# Patient Record
Sex: Female | Born: 1985 | Race: Black or African American | Hispanic: No | Marital: Single | Smoking: Never smoker
Health system: Southern US, Community
[De-identification: ages and names within clinical notes are randomized; demographics above are authoritative.]

## PROBLEM LIST (undated history)

## (undated) ENCOUNTER — Inpatient Hospital Stay (HOSPITAL_COMMUNITY): Payer: Self-pay

## (undated) DIAGNOSIS — R7689 Other specified abnormal immunological findings in serum: Secondary | ICD-10-CM

## (undated) DIAGNOSIS — R768 Other specified abnormal immunological findings in serum: Secondary | ICD-10-CM

## (undated) DIAGNOSIS — I10 Essential (primary) hypertension: Secondary | ICD-10-CM

## (undated) HISTORY — PX: APPENDECTOMY: SHX54

---
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
## 2015-06-27 LAB — OB RESULTS CONSOLE RPR: RPR: NONREACTIVE

## 2015-06-27 LAB — OB RESULTS CONSOLE ABO/RH: RH Type: POSITIVE

## 2015-06-27 LAB — OB RESULTS CONSOLE GC/CHLAMYDIA
CHLAMYDIA, DNA PROBE: NEGATIVE
Gonorrhea: NEGATIVE

## 2015-06-27 LAB — OB RESULTS CONSOLE HIV ANTIBODY (ROUTINE TESTING): HIV: NONREACTIVE

## 2015-06-27 LAB — OB RESULTS CONSOLE RUBELLA ANTIBODY, IGM: Rubella: IMMUNE

## 2015-06-27 LAB — OB RESULTS CONSOLE ANTIBODY SCREEN: Antibody Screen: NEGATIVE

## 2015-06-27 LAB — OB RESULTS CONSOLE HEPATITIS B SURFACE ANTIGEN: Hepatitis B Surface Ag: NEGATIVE

## 2015-09-23 ENCOUNTER — Inpatient Hospital Stay (HOSPITAL_COMMUNITY)
Admission: AD | Admit: 2015-09-23 | Discharge: 2015-09-23 | Disposition: A | Discharge status: Home / Self Care | Payer: BLUE CROSS/BLUE SHIELD | Source: Ambulatory Visit | Attending: Obstetrics and Gynecology | Admitting: Obstetrics and Gynecology

## 2015-09-23 ENCOUNTER — Encounter (HOSPITAL_COMMUNITY): Payer: Self-pay | Admitting: *Deleted

## 2015-09-23 DIAGNOSIS — O10912 Unspecified pre-existing hypertension complicating pregnancy, second trimester: Secondary | ICD-10-CM

## 2015-09-23 DIAGNOSIS — Z87898 Personal history of other specified conditions: Secondary | ICD-10-CM

## 2015-09-23 DIAGNOSIS — Z3A23 23 weeks gestation of pregnancy: Secondary | ICD-10-CM | POA: Diagnosis not present

## 2015-09-23 DIAGNOSIS — O26892 Other specified pregnancy related conditions, second trimester: Secondary | ICD-10-CM | POA: Diagnosis not present

## 2015-09-23 DIAGNOSIS — R51 Headache: Secondary | ICD-10-CM | POA: Diagnosis present

## 2015-09-23 DIAGNOSIS — R519 Headache, unspecified: Secondary | ICD-10-CM

## 2015-09-23 HISTORY — DX: Essential (primary) hypertension: I10

## 2015-09-23 LAB — COMPREHENSIVE METABOLIC PANEL
ALT: 30 U/L (ref 14–54)
AST: 20 U/L (ref 15–41)
Albumin: 3 g/dL — ABNORMAL LOW (ref 3.5–5.0)
Alkaline Phosphatase: 59 U/L (ref 38–126)
Anion gap: 6 (ref 5–15)
BUN: 5 mg/dL — AB (ref 6–20)
CHLORIDE: 108 mmol/L (ref 101–111)
CO2: 22 mmol/L (ref 22–32)
CREATININE: 0.54 mg/dL (ref 0.44–1.00)
Calcium: 8.8 mg/dL — ABNORMAL LOW (ref 8.9–10.3)
GFR calc non Af Amer: >60 mL/min (ref >60)
Glucose, Bld: 86 mg/dL (ref 65–99)
POTASSIUM: 4 mmol/L (ref 3.5–5.1)
SODIUM: 136 mmol/L (ref 135–145)
Total Bilirubin: 0.3 mg/dL (ref 0.3–1.2)
Total Protein: 6.7 g/dL (ref 6.5–8.1)

## 2015-09-23 LAB — CBC
HCT: 29.9 % — ABNORMAL LOW (ref 36.0–46.0)
Hemoglobin: 10 g/dL — ABNORMAL LOW (ref 12.0–15.0)
MCH: 29.1 pg (ref 26.0–34.0)
MCHC: 33.4 g/dL (ref 30.0–36.0)
MCV: 86.9 fL (ref 78.0–100.0)
PLATELETS: 258 10*3/uL (ref 150–400)
RBC: 3.44 MIL/uL — AB (ref 3.87–5.11)
RDW: 15.1 % (ref 11.5–15.5)
WBC: 6.5 10*3/uL (ref 4.0–10.5)

## 2015-09-23 LAB — URINALYSIS, ROUTINE W REFLEX MICROSCOPIC
BILIRUBIN URINE: NEGATIVE
Glucose, UA: NEGATIVE mg/dL
Hgb urine dipstick: NEGATIVE
KETONES UR: NEGATIVE mg/dL
Leukocytes, UA: NEGATIVE
NITRITE: NEGATIVE
Protein, ur: NEGATIVE mg/dL
Specific Gravity, Urine: 1.015 (ref 1.005–1.030)
UROBILINOGEN UA: 0.2 mg/dL (ref 0.0–1.0)
pH: 6 (ref 5.0–8.0)

## 2015-09-23 LAB — PROTEIN / CREATININE RATIO, URINE
Creatinine, Urine: 102 mg/dL
Protein Creatinine Ratio: 0.1 mg/mg{Cre} (ref 0.00–0.15)
TOTAL PROTEIN, URINE: 10 mg/dL

## 2015-09-23 MED ORDER — IBUPROFEN 600 MG PO TABS
600.0000 mg | ORAL_TABLET | Freq: Once | ORAL | Status: AC
  Administered 2015-09-23: 600 mg via ORAL
  Filled 2015-09-23: qty 1

## 2015-09-23 MED ORDER — BUTALBITAL-APAP-CAFFEINE 50-325-40 MG PO TABS: 1.0000 | ORAL_TABLET | Freq: Four times a day (QID) | ORAL | Status: DC | PRN

## 2015-09-23 NOTE — Discharge Instructions (Signed)

## 2015-09-23 NOTE — MAU Note (Signed)
Headache since Sat and feet swollen Thurs but better now. Still has h/a. Took Tylenol 1000mg  Sat last 1600 but did not help. Chronic HTN but no meds during pregnancy. Was on ? Ziac before pregnancy. Took B/P at home this am and some were high and some lower. Some blurred vision on Friday. Denies LOF or bleeding.

## 2015-09-23 NOTE — MAU Provider Note (Signed)
Chief Complaint: Hypertension  First Provider Initiated Contact with Patient 09/23/15 0759     SUBJECTIVE HPI: Lauren Melton is a 29 y.o. G1P0 at [redacted]w[redacted]d by LMP who presents to maternity admissions reporting h/a with onset last night.  BP at home was 140s/90s this morning.  She has hx of hypertension before pregnancy and was taken off her daily BP medications.  She reports hx of brain tumor with chronic h/a afterwards but reports this is more severe than usual.  The h/a started on the left side but is now more on the right and is associated with nausea and light sensitivity.  She denies visual changes or epigastric pain. She denies abdominal pain, vaginal bleeding, vaginal itching/burning, urinary symptoms, dizziness, n/v, or fever/chills.     Headache  This is a new problem. The current episode started yesterday. The problem occurs constantly. The problem has been unchanged. The pain is located in the left unilateral region. The pain does not radiate. The pain quality is similar to prior headaches. The quality of the pain is described as squeezing and dull. The pain is moderate. Associated symptoms include nausea and photophobia. Pertinent negatives include no abdominal pain, blurred vision, dizziness, fever, neck pain, sinus pressure, visual change, vomiting or weakness. The symptoms are aggravated by bright light. She has tried acetaminophen for the symptoms. The treatment provided no relief. Her past medical history is significant for hypertension and migraine headaches.    Past Medical History  Diagnosis Date  . Hypertension    Past Surgical History  Procedure Laterality Date  . Brain tumor excision    . Appendectomy     Social History   Social History  . Marital Status: Single    Spouse Name: N/A  . Number of Children: N/A  . Years of Education: N/A   Occupational History  . Not on file.   Social History Main Topics  . Smoking status: Never Smoker   . Smokeless tobacco:  Never Used  . Alcohol Use: No  . Drug Use: No  . Sexual Activity: Yes   Other Topics Concern  . Not on file   Social History Narrative  . No narrative on file   No current facility-administered medications on file prior to encounter.   No current outpatient prescriptions on file prior to encounter.   No Known Allergies  ROS:  Review of Systems  Constitutional: Negative for fever, chills and fatigue.  HENT: Negative for sinus pressure.   Eyes: Positive for photophobia. Negative for blurred vision.  Respiratory: Negative for shortness of breath.   Cardiovascular: Negative for chest pain.  Gastrointestinal: Positive for nausea. Negative for vomiting, abdominal pain, diarrhea and constipation.  Genitourinary: Negative for dysuria, frequency, flank pain, vaginal bleeding, vaginal discharge, difficulty urinating, vaginal pain and pelvic pain.  Musculoskeletal: Negative for neck pain.  Neurological: Positive for headaches. Negative for dizziness and weakness.  Psychiatric/Behavioral: Negative.      I have reviewed patient's Past Medical Hx, Surgical Hx, Family Hx, Social Hx, medications and allergies.   Physical Exam   Patient Vitals for the past 24 hrs:  BP Temp Pulse Resp SpO2 Height Weight  09/23/15 0847 106/57 mmHg - 69 18 - - -  09/23/15 0648 126/79 mmHg 98.3 F (36.8 C) 90 22 100 % 5\' 7"  (1.702 m) 285 lb (129.275 kg)   Constitutional: Well-developed, well-nourished female in no acute distress.  HEART: normal rate, heart sounds, regular rhythm RESP: normal effort, lung sounds clear and equal bilaterally GI: Abd  soft, non-tender. Pos BS x 4 MS: Extremities nontender, no edema, normal ROM Neurologic: Alert and oriented x 4.  GU: Neg CVAT.  FHT 140 by doppler  LAB RESULTS Results for orders placed or performed during the hospital encounter of 09/23/15 (from the past 24 hour(s))  Urinalysis, Routine w reflex microscopic (not at [facility])     Status: None   Collection  Time: 09/23/15  6:40 AM  Result Value Ref Range   Color, Urine YELLOW YELLOW   APPearance CLEAR CLEAR   Specific Gravity, Urine 1.015 1.005 - 1.030   pH 6.0 5.0 - 8.0   Glucose, UA NEGATIVE NEGATIVE mg/dL   Hgb urine dipstick NEGATIVE NEGATIVE   Bilirubin Urine NEGATIVE NEGATIVE   Ketones, ur NEGATIVE NEGATIVE mg/dL   Protein, ur NEGATIVE NEGATIVE mg/dL   Urobilinogen, UA 0.2 0.0 - 1.0 mg/dL   Nitrite NEGATIVE NEGATIVE   Leukocytes, UA NEGATIVE NEGATIVE  Protein / creatinine ratio, urine     Status: None   Collection Time: 09/23/15  6:40 AM  Result Value Ref Range   Creatinine, Urine 102.00 mg/dL   Total Protein, Urine 10 mg/dL   Protein Creatinine Ratio 0.10 0.00 - 0.15 mg/mg[Cre]  CBC     Status: Abnormal   Collection Time: 09/23/15  8:35 AM  Result Value Ref Range   WBC 6.5 4.0 - 10.5 K/uL   RBC 3.44 (L) 3.87 - 5.11 MIL/uL   Hemoglobin 10.0 (L) 12.0 - 15.0 g/dL   HCT 29.9 (L) 36.0 - 46.0 %   MCV 86.9 78.0 - 100.0 fL   MCH 29.1 26.0 - 34.0 pg   MCHC 33.4 30.0 - 36.0 g/dL   RDW 15.1 11.5 - 15.5 %   Platelets 258 150 - 400 K/uL  Comprehensive metabolic panel     Status: Abnormal   Collection Time: 09/23/15  8:35 AM  Result Value Ref Range   Sodium 136 135 - 145 mmol/L   Potassium 4.0 3.5 - 5.1 mmol/L   Chloride 108 101 - 111 mmol/L   CO2 22 22 - 32 mmol/L   Glucose, Bld 86 65 - 99 mg/dL   BUN 5 (L) 6 - 20 mg/dL   Creatinine, Ser 0.54 0.44 - 1.00 mg/dL   Calcium 8.8 (L) 8.9 - 10.3 mg/dL   Total Protein 6.7 6.5 - 8.1 g/dL   Albumin 3.0 (L) 3.5 - 5.0 g/dL   AST 20 15 - 41 U/L   ALT 30 14 - 54 U/L   Alkaline Phosphatase 59 38 - 126 U/L   Total Bilirubin 0.3 0.3 - 1.2 mg/dL   GFR calc non Af Amer >60 >60 mL/min   GFR calc Af Amer >60 >60 mL/min   Anion gap 6 5 - 15    Report to [place], [place], CNM assumed care of pt at [phone]. CBC, CMET, P:C ratio and Ibuprofen ordered.   HA improved to 4/10. Discussed BP, Sx, Hx w/ Dr. Gaetano Net. Agrees w/ POC.  May Rx #10 Fioricet, but needs to inform provider if HA's are worsening or changing.   MDM Pt w/ Hx CHTN presented w/ HA, but has Hx chronic HA's too. Eval for for Pre-E neg. HA not C/W any apparent emergent intracranial process.    ASSESSMENT: 1. Headache in pregnancy, antepartum, second trimester   2. Chronic hypertension in pregnancy, second trimester   3. History of brain tumor    PLAN: D/C home in stable condition per consult w/ Dr. Gaetano Net. HA  red flags  And comfort measures reviewed.  Preeclampsia precautions.    Follow-up Information    Follow up with [facility] Marjean Donna, MD On 09/26/2015.   Specialty:  Obstetrics and Gynecology   Why:  Routine prenatal visit or sooner , As needed if symptoms worsen   Contact information:   [address] [place] [address] [phone]       Follow up with [place].   Why:  As needed in emergencies   Contact information:   [address] [id number] [place] [phone]        Medication List    TAKE these medications        acetaminophen 500 MG tablet  Commonly known as:  TYLENOL  Take 500 mg by mouth every 6 (six) hours as needed for mild pain or headache.     butalbital-acetaminophen-caffeine 50-325-40 MG per tablet  Commonly known as:  FIORICET  Take 1-2 tablets by mouth every 6 (six) hours as needed for headache.     prenatal multivitamin Tabs tablet  Take 1 tablet by mouth daily at 12 noon.       Fatima Blank Certified Nurse-Midwife 09/23/2015  8:08 AM  Manya Silvas, CNM 09/23/2015 9:31 AM

## 2015-12-28 ENCOUNTER — Inpatient Hospital Stay (HOSPITAL_COMMUNITY)
Admission: AD | Admit: 2015-12-28 | Discharge: 2015-12-28 | Disposition: A | Discharge status: Home / Self Care | Payer: BLUE CROSS/BLUE SHIELD | Source: Ambulatory Visit | Attending: Obstetrics & Gynecology | Admitting: Obstetrics & Gynecology

## 2015-12-28 ENCOUNTER — Encounter (HOSPITAL_COMMUNITY): Payer: Self-pay | Admitting: *Deleted

## 2015-12-28 DIAGNOSIS — R51 Headache: Secondary | ICD-10-CM | POA: Insufficient documentation

## 2015-12-28 DIAGNOSIS — O10913 Unspecified pre-existing hypertension complicating pregnancy, third trimester: Secondary | ICD-10-CM

## 2015-12-28 DIAGNOSIS — O10013 Pre-existing essential hypertension complicating pregnancy, third trimester: Secondary | ICD-10-CM | POA: Insufficient documentation

## 2015-12-28 DIAGNOSIS — Z3A37 37 weeks gestation of pregnancy: Secondary | ICD-10-CM | POA: Insufficient documentation

## 2015-12-28 HISTORY — DX: Other specified abnormal immunological findings in serum: R76.89

## 2015-12-28 HISTORY — DX: Other specified abnormal immunological findings in serum: R76.8

## 2015-12-28 LAB — COMPREHENSIVE METABOLIC PANEL
ALT: 11 U/L — AB (ref 14–54)
AST: 14 U/L — ABNORMAL LOW (ref 15–41)
Albumin: 3.1 g/dL — ABNORMAL LOW (ref 3.5–5.0)
Alkaline Phosphatase: 85 U/L (ref 38–126)
Anion gap: 9 (ref 5–15)
BUN: <5 mg/dL — ABNORMAL LOW (ref 6–20)
CHLORIDE: 103 mmol/L (ref 101–111)
CO2: 22 mmol/L (ref 22–32)
CREATININE: 0.6 mg/dL (ref 0.44–1.00)
Calcium: 9.1 mg/dL (ref 8.9–10.3)
GFR calc non Af Amer: >60 mL/min (ref >60)
Glucose, Bld: 100 mg/dL — ABNORMAL HIGH (ref 65–99)
Potassium: 4 mmol/L (ref 3.5–5.1)
SODIUM: 134 mmol/L — AB (ref 135–145)
Total Bilirubin: 0.2 mg/dL — ABNORMAL LOW (ref 0.3–1.2)
Total Protein: 7 g/dL (ref 6.5–8.1)

## 2015-12-28 LAB — URINALYSIS, ROUTINE W REFLEX MICROSCOPIC
BILIRUBIN URINE: NEGATIVE
Glucose, UA: NEGATIVE mg/dL
Hgb urine dipstick: NEGATIVE
Ketones, ur: NEGATIVE mg/dL
LEUKOCYTES UA: NEGATIVE
NITRITE: NEGATIVE
Protein, ur: NEGATIVE mg/dL
SPECIFIC GRAVITY, URINE: 1.01 (ref 1.005–1.030)
pH: 6 (ref 5.0–8.0)

## 2015-12-28 LAB — CBC
HCT: 31.6 % — ABNORMAL LOW (ref 36.0–46.0)
Hemoglobin: 10.7 g/dL — ABNORMAL LOW (ref 12.0–15.0)
MCH: 29.2 pg (ref 26.0–34.0)
MCHC: 33.9 g/dL (ref 30.0–36.0)
MCV: 86.3 fL (ref 78.0–100.0)
PLATELETS: 272 10*3/uL (ref 150–400)
RBC: 3.66 MIL/uL — AB (ref 3.87–5.11)
RDW: 16.8 % — ABNORMAL HIGH (ref 11.5–15.5)
WBC: 7.5 10*3/uL (ref 4.0–10.5)

## 2015-12-28 LAB — PROTEIN / CREATININE RATIO, URINE
CREATININE, URINE: 41 mg/dL
Protein Creatinine Ratio: 0.2 mg/mg{Cre} — ABNORMAL HIGH (ref 0.00–0.15)
TOTAL PROTEIN, URINE: 8 mg/dL

## 2015-12-28 LAB — LACTATE DEHYDROGENASE: LDH: 116 U/L (ref 98–192)

## 2015-12-28 LAB — URIC ACID: URIC ACID, SERUM: 4.6 mg/dL (ref 2.3–6.6)

## 2015-12-28 NOTE — Discharge Instructions (Signed)

## 2015-12-28 NOTE — MAU Provider Note (Signed)
History     CSN: [id number]  Arrival date and time: 12/28/15 1240   First Provider Initiated Contact with Patient 12/28/15 1323         Chief Complaint  Patient presents with  . Blood Pressure Check   HPI  Lauren Melton is a 29 y.o. G1P0 at [redacted]w[redacted]d who presents for hypertension.  Has history of chronic hypertension, hasn't been on meds since becoming pregnant. 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  BP was elevated in office yesterday. Took BP at home today & it was 135/86. Reports headache earlier today that has since resolved. Has history of headaches & states this headache was just like her normal headaches. Denies vision changes, epigastric pain, chest pain, or SOB.  Denies contractions, LOF, or vaginal bleeding.  Positive fetal movement.   OB History    Gravida Para Term Preterm AB TAB SAB Ectopic Multiple Living   1               Past Medical History  Diagnosis Date  . Hypertension     Past Surgical History  Procedure Laterality Date  . Brain tumor excision    . Appendectomy      History reviewed. No pertinent family history.  Social History  Substance Use Topics  . Smoking status: Never Smoker   . Smokeless tobacco: Never Used  . Alcohol Use: No    Allergies: No Known Allergies  Prescriptions prior to admission  Medication Sig Dispense Refill Last Dose  . acetaminophen (TYLENOL) 500 MG tablet Take 500 mg by mouth every 6 (six) hours as needed for mild pain or headache.   12/28/2015 at Unknown time  . ferrous sulfate 325 (65 FE) MG tablet Take 325 mg by mouth daily with breakfast.   12/28/2015 at Unknown time  . Prenatal Vit-Fe Fumarate-FA (PRENATAL MULTIVITAMIN) TABS tablet Take 1 tablet by mouth daily at 12 noon.   12/27/2015 at Unknown time  . valACYclovir (VALTREX) 500 MG tablet Take 500 mg by mouth 2 (two) times daily.   12/28/2015 at Unknown time  . butalbital-acetaminophen-caffeine (FIORICET)  50-325-40 MG per tablet Take 1-2 tablets by mouth every 6 (six) hours as needed for headache. (Patient not taking: Reported on 12/28/2015) 10 tablet 0     Review of Systems  Constitutional: Negative.   HENT: Negative.   Respiratory: Negative.   Cardiovascular: Negative.   Gastrointestinal: Negative.   Genitourinary: Negative.   Neurological: Negative for dizziness.   Physical Exam   Blood pressure 139/87, pulse 92, temperature 98.5 F (36.9 C), temperature source Oral, resp. rate 18, height 5\' 7"  (1.702 m), weight 298 lb 9.6 oz (135.444 kg), last menstrual period 04/13/2015.  Patient Vitals for the past 24 hrs:  BP Temp Temp src Pulse Resp Height Weight  12/28/15 1539 125/82 mmHg - - 90 - - -  12/28/15 1502 132/74 mmHg - - 82 - - -  12/28/15 1447 145/82 mmHg - - 98 - - -  12/28/15 1437 132/88 mmHg - - 95 - - -  12/28/15 1402 138/78 mmHg - - 88 - - -  12/28/15 1347 106/63 mmHg - - 92 - - -  12/28/15 1315 133/74 mmHg - - 95 - - -  12/28/15 1305 139/87 mmHg - - 92 - - -  12/28/15 1255 141/88 mmHg - - 103 - - -  12/28/15 1252 141/88 mmHg 98.5 F (36.9 C) Oral 103 18 - -  12/28/15 1246 - - - - -  5\' 7"  (1.702 m) 298 lb 9.6 oz (135.444 kg)     Physical Exam  Nursing note and vitals reviewed. Constitutional: She is oriented to person, place, and time. She appears well-developed and well-nourished. No distress.  HENT:  Head: Normocephalic and atraumatic.  Eyes: Conjunctivae are normal. Right eye exhibits no discharge. Left eye exhibits no discharge. No scleral icterus.  Neck: Normal range of motion.  Cardiovascular: Normal rate, regular rhythm and normal heart sounds.   No murmur heard. Respiratory: Effort normal and breath sounds normal. No respiratory distress. She has no wheezes.  GI: Soft. There is no tenderness.  Neurological: She is alert and oriented to person, place, and time. She has normal reflexes.  No clonus  Skin: Skin is warm and dry. She is not diaphoretic.   Psychiatric: She has a normal mood and affect. Her behavior is normal. Judgment and thought content normal.   Fetal Tracing:  Baseline: 155 Variability: moderate Accelerations: 15x15, with episodes of prolonged accelaration Decelerations: none  Toco: irregular   MAU Course  Procedures Results for orders placed or performed during the hospital encounter of 12/28/15 (from the past 24 hour(s))  Urinalysis, Routine w reflex microscopic (not at [facility])     Status: Abnormal   Collection Time: 12/28/15 12:50 PM  Result Value Ref Range   Color, Urine YELLOW YELLOW   APPearance HAZY (A) CLEAR   Specific Gravity, Urine 1.010 1.005 - 1.030   pH 6.0 5.0 - 8.0   Glucose, UA NEGATIVE NEGATIVE mg/dL   Hgb urine dipstick NEGATIVE NEGATIVE   Bilirubin Urine NEGATIVE NEGATIVE   Ketones, ur NEGATIVE NEGATIVE mg/dL   Protein, ur NEGATIVE NEGATIVE mg/dL   Nitrite NEGATIVE NEGATIVE   Leukocytes, UA NEGATIVE NEGATIVE  Protein / creatinine ratio, urine     Status: Abnormal   Collection Time: 12/28/15 12:50 PM  Result Value Ref Range   Creatinine, Urine 41.00 mg/dL   Total Protein, Urine 8 mg/dL   Protein Creatinine Ratio 0.20 (H) 0.00 - 0.15 mg/mg[Cre]  CBC     Status: Abnormal   Collection Time: 12/28/15  1:25 PM  Result Value Ref Range   WBC 7.5 4.0 - 10.5 K/uL   RBC 3.66 (L) 3.87 - 5.11 MIL/uL   Hemoglobin 10.7 (L) 12.0 - 15.0 g/dL   HCT 31.6 (L) 36.0 - 46.0 %   MCV 86.3 78.0 - 100.0 fL   MCH 29.2 26.0 - 34.0 pg   MCHC 33.9 30.0 - 36.0 g/dL   RDW 16.8 (H) 11.5 - 15.5 %   Platelets 272 150 - 400 K/uL  Comprehensive metabolic panel     Status: Abnormal   Collection Time: 12/28/15  1:25 PM  Result Value Ref Range   Sodium 134 (L) 135 - 145 mmol/L   Potassium 4.0 3.5 - 5.1 mmol/L   Chloride 103 101 - 111 mmol/L   CO2 22 22 - 32 mmol/L   Glucose, Bld 100 (H) 65 - 99 mg/dL   BUN <5 (L) 6 - 20 mg/dL   Creatinine, Ser 0.60 0.44 - 1.00 mg/dL   Calcium 9.1 8.9 - 10.3 mg/dL   Total Protein  7.0 6.5 - 8.1 g/dL   Albumin 3.1 (L) 3.5 - 5.0 g/dL   AST 14 (L) 15 - 41 U/L   ALT 11 (L) 14 - 54 U/L   Alkaline Phosphatase 85 38 - 126 U/L   Total Bilirubin 0.2 (L) 0.3 - 1.2 mg/dL   GFR calc non Af Amer >60 >60  mL/min   GFR calc Af Amer >60 >60 mL/min   Anion gap 9 5 - 15  Lactate dehydrogenase     Status: None   Collection Time: 12/28/15  1:25 PM  Result Value Ref Range   LDH 116 98 - 192 U/L  Uric acid     Status: None   Collection Time: 12/28/15  1:25 PM  Result Value Ref Range   Uric Acid, Serum 4.6 2.3 - 6.6 mg/dL    MDM PIH labs pending No severe range BPs Category 1 tracing with episodes of prolonged acceleration S/w Dr. Lynnette Caffey. Discussed FHT, BPs, & labs. Strip reviewed by Dr. Lynnette Caffey. Ok to discharge home.   Assessment and Plan  A: 1. Chronic hypertension in pregnancy, third trimester    P: Discharge home Discussed reasons to return Keep scheduled f/u with ob HTN in pregnancy & fetal kick count education given  Jorje Guild, NP   12/28/2015, 1:22 PM

## 2015-12-28 NOTE — MAU Note (Signed)
Pt went to the doctor yesterday and had high blood pressures.  It was 138/90 in the office.  Pt states she took her pressure around 0930 today and it was 135/86.  Pt states she is feeling the baby move.

## 2016-01-07 ENCOUNTER — Encounter (HOSPITAL_COMMUNITY): Payer: Self-pay | Admitting: *Deleted

## 2016-01-07 ENCOUNTER — Inpatient Hospital Stay (HOSPITAL_COMMUNITY)
Admission: AD | Admit: 2016-01-07 | Discharge: 2016-01-07 | Disposition: A | Discharge status: Home / Self Care | Payer: BLUE CROSS/BLUE SHIELD | Source: Ambulatory Visit | Attending: Obstetrics and Gynecology | Admitting: Obstetrics and Gynecology

## 2016-01-07 DIAGNOSIS — Z3493 Encounter for supervision of normal pregnancy, unspecified, third trimester: Secondary | ICD-10-CM | POA: Insufficient documentation

## 2016-01-07 LAB — URINALYSIS, ROUTINE W REFLEX MICROSCOPIC
Bilirubin Urine: NEGATIVE
Glucose, UA: NEGATIVE mg/dL
Hgb urine dipstick: NEGATIVE
Ketones, ur: NEGATIVE mg/dL
Leukocytes, UA: NEGATIVE
Nitrite: NEGATIVE
Protein, ur: NEGATIVE mg/dL
Specific Gravity, Urine: 1.01 (ref 1.005–1.030)
pH: 6 (ref 5.0–8.0)

## 2016-01-07 LAB — POCT FERN TEST: POCT Fern Test: NEGATIVE

## 2016-01-07 NOTE — Discharge Instructions (Signed)
Braxton Hicks Contractions °Contractions of the uterus can occur throughout pregnancy. Contractions are not always a sign that you are in labor.  °WHAT ARE BRAXTON HICKS CONTRACTIONS?  °Contractions that occur before labor are called Braxton Hicks contractions, or false labor. Toward the end of pregnancy (32-34 weeks), these contractions can develop more often and may become more forceful. This is not true labor because these contractions do not result in opening (dilatation) and thinning of the cervix. They are sometimes difficult to tell apart from true labor because these contractions can be forceful and people have different pain tolerances. You should not feel embarrassed if you go to the hospital with false labor. Sometimes, the only way to tell if you are in true labor is for your health care provider to look for changes in the cervix. °If there are no prenatal problems or other health problems associated with the pregnancy, it is completely safe to be sent home with false labor and await the onset of true labor. °HOW CAN YOU TELL THE DIFFERENCE BETWEEN TRUE AND FALSE LABOR? °False Labor °· The contractions of false labor are usually shorter and not as hard as those of true labor.   °· The contractions are usually irregular.   °· The contractions are often felt in the front of the lower abdomen and in the groin.   °· The contractions may go away when you walk around or change positions while lying down.   °· The contractions get weaker and are shorter lasting as time goes on.   °· The contractions do not usually become progressively stronger, regular, and closer together as with true labor.   °True Labor °1. Contractions in true labor last 30-70 seconds, become very regular, usually become more intense, and increase in frequency.   °2. The contractions do not go away with walking.   °3. The discomfort is usually felt in the top of the uterus and spreads to the lower abdomen and low back.   °4. True labor can  be determined by your health care provider with an exam. This will show that the cervix is dilating and getting thinner.   °WHAT TO REMEMBER °· Keep up with your usual exercises and follow other instructions given by your health care provider.   °· Take medicines as directed by your health care provider.   °· Keep your regular prenatal appointments.   °· Eat and drink lightly if you think you are going into labor.   °· If Braxton Hicks contractions are making you uncomfortable:   °· Change your position from lying down or resting to walking, or from walking to resting.   °· Sit and rest in a tub of warm water.   °· Drink 2-3 glasses of water. Dehydration may cause these contractions.   °· Do slow and deep breathing several times an hour.   °WHEN SHOULD I SEEK IMMEDIATE MEDICAL CARE? °Seek immediate medical care if: °· Your contractions become stronger, more regular, and closer together.   °· You have fluid leaking or gushing from your vagina.   °· You have a fever.   °· You pass blood-tinged mucus.   °· You have vaginal bleeding.   °· You have continuous abdominal pain.   °· You have low back pain that you never had before.   °· You feel your baby's head pushing down and causing pelvic pressure.   °· Your baby is not moving as much as it used to.   °  °This information is not intended to replace advice given to you by your health care provider. Make sure you discuss any questions you have with your health care   provider. °  °Document Released: 12/15/2005 Document Revised: 12/20/2013 Document Reviewed: 09/26/2013 °Elsevier Interactive Patient Education ©2016 [employer] ° °Fetal Movement Counts °Patient Name: __________________________________________________ Patient Due Date: ____________________ °Performing a fetal movement count is highly recommended in high-risk pregnancies, but it is good for every pregnant woman to do. Your health care provider may ask you to start counting fetal movements at 28 weeks of the  pregnancy. Fetal movements often increase: °· After eating a full meal. °· After physical activity. °· After eating or drinking something sweet or cold. °· At rest. °Pay attention to when you feel the baby is most active. This will help you notice a pattern of your baby's sleep and wake cycles and what factors contribute to an increase in fetal movement. It is important to perform a fetal movement count at the same time each day when your baby is normally most active.  °HOW TO COUNT FETAL MOVEMENTS °5. Find a quiet and comfortable area to sit or lie down on your left side. Lying on your left side provides the best blood and oxygen circulation to your baby. °6. Write down the day and time on a sheet of paper or in a journal. °7. Start counting kicks, flutters, swishes, rolls, or jabs in a 2-hour period. You should feel at least 10 movements within 2 hours. °8. If you do not feel 10 movements in 2 hours, wait 2-3 hours and count again. Look for a change in the pattern or not enough counts in 2 hours. °SEEK MEDICAL CARE IF: °· You feel less than 10 counts in 2 hours, tried twice. °· There is no movement in over an hour. °· The pattern is changing or taking longer each day to reach 10 counts in 2 hours. °· You feel the baby is not moving as he or she usually does. °Date: ____________ Movements: ____________ Start time: ____________ Finish time: ____________  °Date: ____________ Movements: ____________ Start time: ____________ Finish time: ____________ °Date: ____________ Movements: ____________ Start time: ____________ Finish time: ____________ °Date: ____________ Movements: ____________ Start time: ____________ Finish time: ____________ °Date: ____________ Movements: ____________ Start time: ____________ Finish time: ____________ °Date: ____________ Movements: ____________ Start time: ____________ Finish time: ____________ °Date: ____________ Movements: ____________ Start time: ____________ Finish time:  ____________ °Date: ____________ Movements: ____________ Start time: ____________ Finish time: ____________  °Date: ____________ Movements: ____________ Start time: ____________ Finish time: ____________ °Date: ____________ Movements: ____________ Start time: ____________ Finish time: ____________ °Date: ____________ Movements: ____________ Start time: ____________ Finish time: ____________ °Date: ____________ Movements: ____________ Start time: ____________ Finish time: ____________ °Date: ____________ Movements: ____________ Start time: ____________ Finish time: ____________ °Date: ____________ Movements: ____________ Start time: ____________ Finish time: ____________ °Date: ____________ Movements: ____________ Start time: ____________ Finish time: ____________  °Date: ____________ Movements: ____________ Start time: ____________ Finish time: ____________ °Date: ____________ Movements: ____________ Start time: ____________ Finish time: ____________ °Date: ____________ Movements: ____________ Start time: ____________ Finish time: ____________ °Date: ____________ Movements: ____________ Start time: ____________ Finish time: ____________ °Date: ____________ Movements: ____________ Start time: ____________ Finish time: ____________ °Date: ____________ Movements: ____________ Start time: ____________ Finish time: ____________ °Date: ____________ Movements: ____________ Start time: ____________ Finish time: ____________  °Date: ____________ Movements: ____________ Start time: ____________ Finish time: ____________ °Date: ____________ Movements: ____________ Start time: ____________ Finish time: ____________ °Date: ____________ Movements: ____________ Start time: ____________ Finish time: ____________ °Date: ____________ Movements: ____________ Start time: ____________ Finish time: ____________ °Date: ____________ Movements: ____________ Start time: ____________ Finish time: ____________ °Date: ____________ Movements:  ____________ Start time: ____________ Finish   time: ____________ °Date: ____________ Movements: ____________ Start time: ____________ Finish time: ____________  °Date: ____________ Movements: ____________ Start time: ____________ Finish time: ____________ °Date: ____________ Movements: ____________ Start time: ____________ Finish time: ____________ °Date: ____________ Movements: ____________ Start time: ____________ Finish time: ____________ °Date: ____________ Movements: ____________ Start time: ____________ Finish time: ____________ °Date: ____________ Movements: ____________ Start time: ____________ Finish time: ____________ °Date: ____________ Movements: ____________ Start time: ____________ Finish time: ____________ °Date: ____________ Movements: ____________ Start time: ____________ Finish time: ____________  °Date: ____________ Movements: ____________ Start time: ____________ Finish time: ____________ °Date: ____________ Movements: ____________ Start time: ____________ Finish time: ____________ °Date: ____________ Movements: ____________ Start time: ____________ Finish time: ____________ °Date: ____________ Movements: ____________ Start time: ____________ Finish time: ____________ °Date: ____________ Movements: ____________ Start time: ____________ Finish time: ____________ °Date: ____________ Movements: ____________ Start time: ____________ Finish time: ____________ °Date: ____________ Movements: ____________ Start time: ____________ Finish time: ____________  °Date: ____________ Movements: ____________ Start time: ____________ Finish time: ____________ °Date: ____________ Movements: ____________ Start time: ____________ Finish time: ____________ °Date: ____________ Movements: ____________ Start time: ____________ Finish time: ____________ °Date: ____________ Movements: ____________ Start time: ____________ Finish time: ____________ °Date: ____________ Movements: ____________ Start time: ____________ Finish  time: ____________ °Date: ____________ Movements: ____________ Start time: ____________ Finish time: ____________ °Date: ____________ Movements: ____________ Start time: ____________ Finish time: ____________  °Date: ____________ Movements: ____________ Start time: ____________ Finish time: ____________ °Date: ____________ Movements: ____________ Start time: ____________ Finish time: ____________ °Date: ____________ Movements: ____________ Start time: ____________ Finish time: ____________ °Date: ____________ Movements: ____________ Start time: ____________ Finish time: ____________ °Date: ____________ Movements: ____________ Start time: ____________ Finish time: ____________ °Date: ____________ Movements: ____________ Start time: ____________ Finish time: ____________ °  °This information is not intended to replace advice given to you by your health care provider. Make sure you discuss any questions you have with your health care provider. °  °Document Released: 01/14/2007 Document Revised: 01/05/2015 Document Reviewed: 10/11/2012 °Elsevier Interactive Patient Education ©2016 [employer] ° °

## 2016-01-07 NOTE — MAU Note (Signed)
Pt said her underwear were wet this morning with a white discharge and again after being at work for a few hours.  Pain at the bottom of her stomach, tightening that comes and goes.  Denies VB.

## 2016-01-12 ENCOUNTER — Encounter (HOSPITAL_COMMUNITY): Payer: Self-pay | Admitting: *Deleted

## 2016-01-12 ENCOUNTER — Inpatient Hospital Stay (HOSPITAL_COMMUNITY)
Admission: AD | Admit: 2016-01-12 | Discharge: 2016-01-12 | Disposition: A | Discharge status: Home / Self Care | Payer: BLUE CROSS/BLUE SHIELD | Source: Ambulatory Visit | Attending: Obstetrics and Gynecology | Admitting: Obstetrics and Gynecology

## 2016-01-12 DIAGNOSIS — M549 Dorsalgia, unspecified: Secondary | ICD-10-CM | POA: Diagnosis present

## 2016-01-12 DIAGNOSIS — O471 False labor at or after 37 completed weeks of gestation: Secondary | ICD-10-CM

## 2016-01-12 DIAGNOSIS — O479 False labor, unspecified: Secondary | ICD-10-CM

## 2016-01-12 DIAGNOSIS — Z3A39 39 weeks gestation of pregnancy: Secondary | ICD-10-CM | POA: Insufficient documentation

## 2016-01-12 DIAGNOSIS — O10913 Unspecified pre-existing hypertension complicating pregnancy, third trimester: Secondary | ICD-10-CM | POA: Insufficient documentation

## 2016-01-12 LAB — URINALYSIS, ROUTINE W REFLEX MICROSCOPIC
BILIRUBIN URINE: NEGATIVE
Glucose, UA: NEGATIVE mg/dL
KETONES UR: NEGATIVE mg/dL
Leukocytes, UA: NEGATIVE
NITRITE: NEGATIVE
PH: 5.5 (ref 5.0–8.0)
Protein, ur: NEGATIVE mg/dL
SPECIFIC GRAVITY, URINE: 1.01 (ref 1.005–1.030)

## 2016-01-12 LAB — COMPREHENSIVE METABOLIC PANEL
ALBUMIN: 3.3 g/dL — AB (ref 3.5–5.0)
ALT: 14 U/L (ref 14–54)
AST: 16 U/L (ref 15–41)
Alkaline Phosphatase: 96 U/L (ref 38–126)
Anion gap: 10 (ref 5–15)
BILIRUBIN TOTAL: 0.5 mg/dL (ref 0.3–1.2)
BUN: 6 mg/dL (ref 6–20)
CHLORIDE: 107 mmol/L (ref 101–111)
CO2: 21 mmol/L — ABNORMAL LOW (ref 22–32)
CREATININE: 0.64 mg/dL (ref 0.44–1.00)
Calcium: 9.5 mg/dL (ref 8.9–10.3)
GFR calc Af Amer: >60 mL/min (ref >60)
GFR calc non Af Amer: >60 mL/min (ref >60)
GLUCOSE: 75 mg/dL (ref 65–99)
POTASSIUM: 3.6 mmol/L (ref 3.5–5.1)
Sodium: 138 mmol/L (ref 135–145)
Total Protein: 7.4 g/dL (ref 6.5–8.1)

## 2016-01-12 LAB — CBC
HEMATOCRIT: 32.8 % — AB (ref 36.0–46.0)
Hemoglobin: 11.2 g/dL — ABNORMAL LOW (ref 12.0–15.0)
MCH: 29.2 pg (ref 26.0–34.0)
MCHC: 34.1 g/dL (ref 30.0–36.0)
MCV: 85.4 fL (ref 78.0–100.0)
PLATELETS: 284 10*3/uL (ref 150–400)
RBC: 3.84 MIL/uL — AB (ref 3.87–5.11)
RDW: 16.8 % — AB (ref 11.5–15.5)
WBC: 9.4 10*3/uL (ref 4.0–10.5)

## 2016-01-12 LAB — URINE MICROSCOPIC-ADD ON

## 2016-01-12 LAB — PROTEIN / CREATININE RATIO, URINE
Creatinine, Urine: 100 mg/dL
Protein Creatinine Ratio: 0.18 mg/mg{Cre} — ABNORMAL HIGH (ref 0.00–0.15)
Total Protein, Urine: 18 mg/dL

## 2016-01-12 LAB — ABO/RH: ABO/RH(D): B POS

## 2016-01-12 LAB — TYPE AND SCREEN
ABO/RH(D): B POS
ANTIBODY SCREEN: NEGATIVE

## 2016-01-12 MED ORDER — OXYCODONE-ACETAMINOPHEN 5-325 MG PO TABS: 1.0000 | ORAL_TABLET | Freq: Four times a day (QID) | ORAL | Status: DC | PRN

## 2016-01-12 MED ORDER — NIFEDIPINE 10 MG PO CAPS
10.0000 mg | ORAL_CAPSULE | Freq: Three times a day (TID) | ORAL | Status: DC
Start: 2016-01-12 — End: 2016-01-14

## 2016-01-12 MED ORDER — NIFEDIPINE 10 MG PO CAPS
20.0000 mg | ORAL_CAPSULE | Freq: Once | ORAL | Status: AC
  Administered 2016-01-12: 20 mg via ORAL
  Filled 2016-01-12: qty 2

## 2016-01-12 MED ORDER — LACTATED RINGERS IV BOLUS (SEPSIS)
1000.0000 mL | Freq: Once | INTRAVENOUS | Status: AC
  Administered 2016-01-12: 1000 mL via INTRAVENOUS

## 2016-01-12 MED ORDER — BUTORPHANOL TARTRATE 1 MG/ML IJ SOLN
1.0000 mg | Freq: Once | INTRAMUSCULAR | Status: AC
  Administered 2016-01-12: 1 mg via INTRAVENOUS
  Filled 2016-01-12: qty 1

## 2016-01-12 NOTE — MAU Note (Addendum)
Pain in lower back and pelvic, coming every 8-10 min.  Has a mucous d/c  Has not been checked, is scheduled for a c/s on Wed 1/18, for herpes

## 2016-01-12 NOTE — MAU Provider Note (Signed)
MAU HISTORY AND PHYSICAL  Chief Complaint:  Back Pain and Pelvic Pain   Lauren Melton is a 30 y.o.  G1P0 with IUP at [redacted]w[redacted]d presenting for Back Pain and Pelvic Pain  Pain comes and goes, lower abdomen. Here Monday with similar pains, fingertip. Plan for c/s 2/2 hsv. No leakage of fluid. No bleeding. Positive fetal movement. No headache, vision change, ruq pain, or sob.    Past Medical History  Diagnosis Date  . Hypertension   . 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 Brain tumor excision    . Appendectomy      History reviewed. No pertinent family history.  Social History  Substance Use Topics  . Smoking status: Never Smoker   . Smokeless tobacco: Never Used  . Alcohol Use: No    No Known Allergies  Prescriptions prior to admission  Medication Sig Dispense Refill Last Dose  . acetaminophen (TYLENOL) 500 MG tablet Take 500-1,000 mg by mouth every 6 (six) hours as needed for mild pain or headache.    Past Week at Unknown time  . Prenatal Vit-Fe Fumarate-FA (PRENATAL MULTIVITAMIN) TABS tablet Take 1 tablet by mouth daily at 12 noon.   01/12/2016 at Unknown time  . valACYclovir (VALTREX) 500 MG tablet Take 500 mg by mouth 2 (two) times daily.   01/12/2016 at Unknown time  . butalbital-acetaminophen-caffeine (FIORICET) 50-325-40 MG per tablet Take 1-2 tablets by mouth every 6 (six) hours as needed for headache. (Patient not taking: Reported on 12/28/2015) 10 tablet 0 Not Taking at Unknown time    Review of Systems - Negative except for what is mentioned in HPI.  Physical Exam  Blood pressure 134/68, pulse 95, temperature 98.3 F (36.8 C), temperature source Oral, resp. rate 20, last menstrual period 04/13/2015. GENERAL: Well-developed, well-nourished female in no acute distress.  LUNGS: Clear to auscultation bilaterally.  HEART: Regular rate and rhythm. ABDOMEN: Soft, nontender, nondistended, gravid.   EXTREMITIES: Nontender, no edema, 2+ distal pulses. Cervical Exam: 1.5/50/-3 Presentation: cephalic FHT:  0000000 Contractions: Every 3-5 mins   Labs: Results for orders placed or performed during the hospital encounter of 01/12/16 (from the past 24 hour(s))  Protein / creatinine ratio, urine   Collection Time: 01/12/16  4:07 PM  Result Value Ref Range   Creatinine, Urine 100.00 mg/dL   Total Protein, Urine 18 mg/dL   Protein Creatinine Ratio 0.18 (H) 0.00 - 0.15 mg/mg[Cre]  Urinalysis, Routine w reflex microscopic (not at [facility])   Collection Time: 01/12/16  4:07 PM  Result Value Ref Range   Color, Urine YELLOW YELLOW   APPearance CLEAR CLEAR   Specific Gravity, Urine 1.010 1.005 - 1.030   pH 5.5 5.0 - 8.0   Glucose, UA NEGATIVE NEGATIVE mg/dL   Hgb urine dipstick TRACE (A) NEGATIVE   Bilirubin Urine NEGATIVE NEGATIVE   Ketones, ur NEGATIVE NEGATIVE mg/dL   Protein, ur NEGATIVE NEGATIVE mg/dL   Nitrite NEGATIVE NEGATIVE   Leukocytes, UA NEGATIVE NEGATIVE  Urine microscopic-add on   Collection Time: 01/12/16  4:07 PM  Result Value Ref Range   Squamous Epithelial / LPF 0-5 (A) NONE SEEN   WBC, UA 0-5 0 - 5 WBC/hpf   RBC / HPF 0-5 0 - 5 RBC/hpf   Bacteria, UA FEW (A) NONE SEEN  CBC   Collection Time: 01/12/16  4:15 PM  Result Value Ref Range   WBC 9.4 4.0 - 10.5 K/uL  RBC 3.84 (L) 3.87 - 5.11 MIL/uL   Hemoglobin 11.2 (L) 12.0 - 15.0 g/dL   HCT 32.8 (L) 36.0 - 46.0 %   MCV 85.4 78.0 - 100.0 fL   MCH 29.2 26.0 - 34.0 pg   MCHC 34.1 30.0 - 36.0 g/dL   RDW 16.8 (H) 11.5 - 15.5 %   Platelets 284 150 - 400 K/uL  Comprehensive metabolic panel   Collection Time: 01/12/16  4:15 PM  Result Value Ref Range   Sodium 138 135 - 145 mmol/L   Potassium 3.6 3.5 - 5.1 mmol/L   Chloride 107 101 - 111 mmol/L   CO2 21 (L) 22 - 32 mmol/L   Glucose, Bld 75 65 - 99 mg/dL   BUN 6 6 - 20 mg/dL   Creatinine, Ser 0.64 0.44 - 1.00 mg/dL   Calcium 9.5 8.9 - 10.3 mg/dL   Total Protein  7.4 6.5 - 8.1 g/dL   Albumin 3.3 (L) 3.5 - 5.0 g/dL   AST 16 15 - 41 U/L   ALT 14 14 - 54 U/L   Alkaline Phosphatase 96 38 - 126 U/L   Total Bilirubin 0.5 0.3 - 1.2 mg/dL   GFR calc non Af Amer >60 >60 mL/min   GFR calc Af Amer >60 >60 mL/min   Anion gap 10 5 - 15  Type and screen   Collection Time: 01/12/16  4:15 PM  Result Value Ref Range   ABO/RH(D) B POS    Antibody Screen NEG    Sample Expiration 01/15/2016     Imaging Studies:  No results found.  Assessment: Lauren Melton is  30 y.o. G1P0 at [redacted]w[redacted]d presents with [facility] contractions. No cervical change after 2 hours of monitoring. Painful contractions improved after stadol and nifedipine and IV fluids. NST reactive but baseline elevated, improved to normal baseline after above intervention. Membranes intact. UA not suggestive of infection. Initial bp severe range, after that wnl to moderate elevation, which is acceptable in pt w/ chronic htn. No preE symptoms; preE labs unremarkable. Discussed w/ Dr. Corinna Capra.  Plan: - hydration, percocet/nifedipine prn, labor/prom return precautions - call clinic on Monday to be seen, consider c/s that day instead of 2/1  Desma Maxim 1/14/20176:18 PM

## 2016-01-12 NOTE — Discharge Instructions (Signed)
Braxton Hicks Contractions °Contractions of the uterus can occur throughout pregnancy. Contractions are not always a sign that you are in labor.  °WHAT ARE BRAXTON HICKS CONTRACTIONS?  °Contractions that occur before labor are called Braxton Hicks contractions, or false labor. Toward the end of pregnancy (32-34 weeks), these contractions can develop more often and may become more forceful. This is not true labor because these contractions do not result in opening (dilatation) and thinning of the cervix. They are sometimes difficult to tell apart from true labor because these contractions can be forceful and people have different pain tolerances. You should not feel embarrassed if you go to the hospital with false labor. Sometimes, the only way to tell if you are in true labor is for your health care provider to look for changes in the cervix. °If there are no prenatal problems or other health problems associated with the pregnancy, it is completely safe to be sent home with false labor and await the onset of true labor. °HOW CAN YOU TELL THE DIFFERENCE BETWEEN TRUE AND FALSE LABOR? °False Labor °· The contractions of false labor are usually shorter and not as hard as those of true labor.   °· The contractions are usually irregular.   °· The contractions are often felt in the front of the lower abdomen and in the groin.   °· The contractions may go away when you walk around or change positions while lying down.   °· The contractions get weaker and are shorter lasting as time goes on.   °· The contractions do not usually become progressively stronger, regular, and closer together as with true labor.   °True Labor °· Contractions in true labor last 30-70 seconds, become very regular, usually become more intense, and increase in frequency.   °· The contractions do not go away with walking.   °· The discomfort is usually felt in the top of the uterus and spreads to the lower abdomen and low back.   °· True labor can be  determined by your health care provider with an exam. This will show that the cervix is dilating and getting thinner.   °WHAT TO REMEMBER °· Keep up with your usual exercises and follow other instructions given by your health care provider.   °· Take medicines as directed by your health care provider.   °· Keep your regular prenatal appointments.   °· Eat and drink lightly if you think you are going into labor.   °· If Braxton Hicks contractions are making you uncomfortable:   °¨ Change your position from lying down or resting to walking, or from walking to resting.   °¨ Sit and rest in a tub of warm water.   °¨ Drink 2-3 glasses of water. Dehydration may cause these contractions.   °¨ Do slow and deep breathing several times an hour.   °WHEN SHOULD I SEEK IMMEDIATE MEDICAL CARE? °Seek immediate medical care if: °· Your contractions become stronger, more regular, and closer together.   °· You have fluid leaking or gushing from your vagina.   °· You have a fever.   °· You pass blood-tinged mucus.   °· You have vaginal bleeding.   °· You have continuous abdominal pain.   °· You have low back pain that you never had before.   °· You feel your baby's head pushing down and causing pelvic pressure.   °· Your baby is not moving as much as it used to.   °  °This information is not intended to replace advice given to you by your health care provider. Make sure you discuss any questions you have with your health care   provider. °  °Document Released: 12/15/2005 Document Revised: 12/20/2013 Document Reviewed: 09/26/2013 °Elsevier Interactive Patient Education ©2016 [employer] Inc. ° °

## 2016-01-13 ENCOUNTER — Inpatient Hospital Stay (HOSPITAL_COMMUNITY)
Admission: AD | Admit: 2016-01-13 | Discharge: 2016-01-16 | DRG: 765 | Disposition: A | Discharge status: Home / Self Care | Payer: BLUE CROSS/BLUE SHIELD | Source: Ambulatory Visit | Attending: Obstetrics and Gynecology | Admitting: Obstetrics and Gynecology

## 2016-01-13 ENCOUNTER — Encounter (HOSPITAL_COMMUNITY)
Admission: AD | Disposition: A | Discharge status: Home / Self Care | Payer: Self-pay | Source: Ambulatory Visit | Attending: Obstetrics and Gynecology

## 2016-01-13 ENCOUNTER — Encounter (HOSPITAL_COMMUNITY): Payer: Self-pay | Admitting: *Deleted

## 2016-01-13 DIAGNOSIS — O164 Unspecified maternal hypertension, complicating childbirth: Secondary | ICD-10-CM | POA: Diagnosis present

## 2016-01-13 DIAGNOSIS — Z6841 Body Mass Index (BMI) 40.0 and over, adult: Secondary | ICD-10-CM

## 2016-01-13 DIAGNOSIS — O99824 Streptococcus B carrier state complicating childbirth: Secondary | ICD-10-CM | POA: Diagnosis present

## 2016-01-13 DIAGNOSIS — Z3A39 39 weeks gestation of pregnancy: Secondary | ICD-10-CM

## 2016-01-13 DIAGNOSIS — O99214 Obesity complicating childbirth: Secondary | ICD-10-CM | POA: Diagnosis present

## 2016-01-13 LAB — RPR: RPR Ser Ql: NONREACTIVE

## 2016-01-13 SURGERY — Surgical Case
Anesthesia: Spinal

## 2016-01-13 MED ORDER — FAMOTIDINE IN NACL 20-0.9 MG/50ML-% IV SOLN
20.0000 mg | Freq: Once | INTRAVENOUS | Status: AC
  Administered 2016-01-14: 20 mg via INTRAVENOUS
  Filled 2016-01-13: qty 50

## 2016-01-13 MED ORDER — LACTATED RINGERS IV SOLN
INTRAVENOUS | Status: DC
  Administered 2016-01-14 (×4): via INTRAVENOUS

## 2016-01-13 MED ORDER — CITRIC ACID-SODIUM CITRATE 334-500 MG/5ML PO SOLN
30.0000 mL | Freq: Once | ORAL | Status: AC
  Administered 2016-01-14: 30 mL via ORAL
  Filled 2016-01-13: qty 15

## 2016-01-13 SURGICAL SUPPLY — 29 items
CLAMP CORD UMBIL (MISCELLANEOUS) IMPLANT
CLOSURE STERI STRIP 1/2 X4 (GAUZE/BANDAGES/DRESSINGS) ×2 IMPLANT
CLOTH BEACON ORANGE TIMEOUT ST (SAFETY) ×2 IMPLANT
DRAPE SHEET LG 3/4 BI-LAMINATE (DRAPES) IMPLANT
DRSG OPSITE POSTOP 4X10 (GAUZE/BANDAGES/DRESSINGS) ×2 IMPLANT
DURAPREP 26ML APPLICATOR (WOUND CARE) ×2 IMPLANT
ELECT REM PT RETURN 9FT ADLT (ELECTROSURGICAL) ×2
ELECTRODE REM PT RTRN 9FT ADLT (ELECTROSURGICAL) ×1 IMPLANT
EXTRACTOR VACUUM M CUP 4 TUBE (SUCTIONS) IMPLANT
GLOVE BIOGEL PI IND STRL 7.0 (GLOVE) ×1 IMPLANT
GLOVE BIOGEL PI INDICATOR 7.0 (GLOVE) ×1
GLOVE SURG ORTHO 8.0 STRL STRW (GLOVE) ×2 IMPLANT
GOWN STRL REUS W/TWL LRG LVL3 (GOWN DISPOSABLE) ×4 IMPLANT
KIT ABG SYR 3ML LUER SLIP (SYRINGE) ×2 IMPLANT
NEEDLE HYPO 25X5/8 SAFETYGLIDE (NEEDLE) ×2 IMPLANT
NS IRRIG 1000ML POUR BTL (IV SOLUTION) ×2 IMPLANT
PACK C SECTION WH (CUSTOM PROCEDURE TRAY) ×2 IMPLANT
PAD OB MATERNITY 4.3X12.25 (PERSONAL CARE ITEMS) ×2 IMPLANT
PENCIL SMOKE EVAC W/HOLSTER (ELECTROSURGICAL) ×2 IMPLANT
RTRCTR C-SECT PINK 25CM LRG (MISCELLANEOUS) ×2 IMPLANT
SUT MNCRL 0 VIOLET CTX 36 (SUTURE) ×3 IMPLANT
SUT MON AB 4-0 PS1 27 (SUTURE) ×2 IMPLANT
SUT MONOCRYL 0 CTX 36 (SUTURE) ×3
SUT PDS AB 1 CT  36 (SUTURE)
SUT PDS AB 1 CT 36 (SUTURE) IMPLANT
SUT VIC AB 1 CTX 36 (SUTURE)
SUT VIC AB 1 CTX36XBRD ANBCTRL (SUTURE) IMPLANT
TOWEL OR 17X24 6PK STRL BLUE (TOWEL DISPOSABLE) ×2 IMPLANT
TRAY FOLEY CATH SILVER 14FR (SET/KITS/TRAYS/PACK) ×2 IMPLANT

## 2016-01-13 NOTE — MAU Note (Signed)
Contractions since last night. No leaking or Bleeding.

## 2016-01-13 NOTE — H&P (Signed)
Lauren Melton is a 30 y.o. female presenting for active labor with ctxs q 3 and cx 4 cm.. History of HSV ab but never had active lesions.  Pt denies sxs but is on valtrex suppresion and request primary c/s.  Preg also complicated by GBS+ and CHTN . History OB History    Gravida Para Term Preterm AB TAB SAB Ectopic Multiple Living   1              Past Medical History  Diagnosis Date  . Hypertension   . 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 Brain tumor excision    . Appendectomy     Family History: family history is not on file. Social History:  reports that she has never smoked. She has never used smokeless tobacco. She reports that she does not drink alcohol or use illicit drugs.   Prenatal Transfer Tool  Maternal Diabetes: No Genetic Screening: Abnormal:  Results: Other: sickle trait  Maternal Ultrasounds/Referrals: Normal Fetal Ultrasounds or other Referrals:  None Maternal Substance Abuse:  No Significant Maternal Medications:  None Significant Maternal Lab Results:  None Other Comments:  None  ROS  Dilation: 4.5 (Simultaneous filing. User may not have seen previous data.) Effacement (%): 90 (Simultaneous filing. User may not have seen previous data.) Station: -2 (Simultaneous filing. User may not have seen previous data.) Exam by:: Etter Sjogren RN, Joya Gaskins RN (Simultaneous filing. User may not have seen previous data.) Blood pressure 149/86, pulse 87, temperature 98.2 F (36.8 C), temperature source Oral, resp. rate 18, height 5\' 6"  (1.676 m), weight 302 lb (136.986 kg), last menstrual period 04/13/2015. Exam Physical Exam  Prenatal labs: ABO, Rh: --/--/B POS, B POS (01/14 1615) Antibody: NEG (01/14 1615) Rubella: Immune (06/29 0000) RPR: Non Reactive (01/14 1615)  HBsAg: Negative (06/29 0000)  HIV: Non-reactive (06/29 0000)  GBS:     Assessment/Plan: IUp at term Active labor Hx of  HSV seropositive without active lesions (or history of lesions) Pt refuses TOL and requests primary C/S CHTN - no evidence of PIH Risks and benefits of C/S were discussed.  All questions were answered and informed consent was obtained.  Plan to proceed with low segment transverse Cesarean Section.  Olof Marcil C 01/13/2016, 11:54 PM

## 2016-01-14 ENCOUNTER — Other Ambulatory Visit (HOSPITAL_COMMUNITY): Payer: BLUE CROSS/BLUE SHIELD

## 2016-01-14 ENCOUNTER — Encounter (HOSPITAL_COMMUNITY): Payer: Self-pay | Admitting: *Deleted

## 2016-01-14 ENCOUNTER — Inpatient Hospital Stay (HOSPITAL_COMMUNITY): Payer: BLUE CROSS/BLUE SHIELD | Admitting: Anesthesiology

## 2016-01-14 ENCOUNTER — Encounter (HOSPITAL_COMMUNITY): Payer: Self-pay | Admitting: Certified Registered Nurse Anesthetist

## 2016-01-14 DIAGNOSIS — Z6841 Body Mass Index (BMI) 40.0 and over, adult: Secondary | ICD-10-CM | POA: Diagnosis not present

## 2016-01-14 DIAGNOSIS — O99824 Streptococcus B carrier state complicating childbirth: Secondary | ICD-10-CM | POA: Diagnosis present

## 2016-01-14 DIAGNOSIS — O99214 Obesity complicating childbirth: Secondary | ICD-10-CM | POA: Diagnosis present

## 2016-01-14 DIAGNOSIS — Z3A39 39 weeks gestation of pregnancy: Secondary | ICD-10-CM | POA: Diagnosis not present

## 2016-01-14 DIAGNOSIS — O164 Unspecified maternal hypertension, complicating childbirth: Secondary | ICD-10-CM | POA: Diagnosis present

## 2016-01-14 LAB — CBC
HCT: 31.2 % — ABNORMAL LOW (ref 36.0–46.0)
HEMATOCRIT: 27.6 % — AB (ref 36.0–46.0)
HEMOGLOBIN: 9.3 g/dL — AB (ref 12.0–15.0)
Hemoglobin: 10.8 g/dL — ABNORMAL LOW (ref 12.0–15.0)
MCH: 28.7 pg (ref 26.0–34.0)
MCH: 29.3 pg (ref 26.0–34.0)
MCHC: 33.7 g/dL (ref 30.0–36.0)
MCHC: 34.6 g/dL (ref 30.0–36.0)
MCV: 84.8 fL (ref 78.0–100.0)
MCV: 85.2 fL (ref 78.0–100.0)
PLATELETS: 255 10*3/uL (ref 150–400)
Platelets: 236 10*3/uL (ref 150–400)
RBC: 3.24 MIL/uL — ABNORMAL LOW (ref 3.87–5.11)
RBC: 3.68 MIL/uL — ABNORMAL LOW (ref 3.87–5.11)
RDW: 16.8 % — ABNORMAL HIGH (ref 11.5–15.5)
RDW: 16.9 % — AB (ref 11.5–15.5)
WBC: 10.4 10*3/uL (ref 4.0–10.5)
WBC: 8.9 10*3/uL (ref 4.0–10.5)

## 2016-01-14 LAB — COMPREHENSIVE METABOLIC PANEL
ALBUMIN: 3.2 g/dL — AB (ref 3.5–5.0)
ALT: 13 U/L — ABNORMAL LOW (ref 14–54)
ANION GAP: 9 (ref 5–15)
AST: 16 U/L (ref 15–41)
Alkaline Phosphatase: 103 U/L (ref 38–126)
BUN: 7 mg/dL (ref 6–20)
CHLORIDE: 107 mmol/L (ref 101–111)
CO2: 22 mmol/L (ref 22–32)
Calcium: 9.5 mg/dL (ref 8.9–10.3)
Creatinine, Ser: 0.48 mg/dL (ref 0.44–1.00)
GFR calc Af Amer: >60 mL/min (ref >60)
GFR calc non Af Amer: >60 mL/min (ref >60)
GLUCOSE: 94 mg/dL (ref 65–99)
POTASSIUM: 3.9 mmol/L (ref 3.5–5.1)
SODIUM: 138 mmol/L (ref 135–145)
TOTAL PROTEIN: 6.8 g/dL (ref 6.5–8.1)
Total Bilirubin: 0.4 mg/dL (ref 0.3–1.2)

## 2016-01-14 LAB — TYPE AND SCREEN
ABO/RH(D): B POS
ANTIBODY SCREEN: NEGATIVE

## 2016-01-14 LAB — RPR: RPR: NONREACTIVE

## 2016-01-14 MED ORDER — FENTANYL CITRATE (PF) 100 MCG/2ML IJ SOLN
INTRAMUSCULAR | Status: DC | PRN
  Administered 2016-01-14: 12.5 ug via INTRATHECAL

## 2016-01-14 MED ORDER — KETOROLAC TROMETHAMINE 30 MG/ML IJ SOLN
INTRAMUSCULAR | Status: AC
  Administered 2016-01-14: 30 mg via INTRAVENOUS
  Filled 2016-01-14: qty 1

## 2016-01-14 MED ORDER — SIMETHICONE 80 MG PO CHEW
80.0000 mg | CHEWABLE_TABLET | ORAL | Status: DC
  Administered 2016-01-14 – 2016-01-16 (×2): 80 mg via ORAL
  Filled 2016-01-14 (×2): qty 1

## 2016-01-14 MED ORDER — KETOROLAC TROMETHAMINE 30 MG/ML IJ SOLN
30.0000 mg | Freq: Four times a day (QID) | INTRAMUSCULAR | Status: AC | PRN
  Administered 2016-01-14: 30 mg via INTRAVENOUS

## 2016-01-14 MED ORDER — SIMETHICONE 80 MG PO CHEW
80.0000 mg | CHEWABLE_TABLET | ORAL | Status: DC | PRN
  Filled 2016-01-14: qty 1

## 2016-01-14 MED ORDER — MORPHINE SULFATE (PF) 0.5 MG/ML IJ SOLN
INTRAMUSCULAR | Status: DC | PRN
  Administered 2016-01-14: .1 mg via INTRATHECAL

## 2016-01-14 MED ORDER — OXYCODONE HCL 5 MG PO TABS: 10.0000 mg | ORAL_TABLET | ORAL | Status: DC | PRN

## 2016-01-14 MED ORDER — DIPHENHYDRAMINE HCL 50 MG/ML IJ SOLN: 12.5000 mg | INTRAMUSCULAR | Status: DC | PRN

## 2016-01-14 MED ORDER — OXYCODONE HCL 5 MG PO TABS: 5.0000 mg | ORAL_TABLET | ORAL | Status: DC | PRN

## 2016-01-14 MED ORDER — IBUPROFEN 600 MG PO TABS
600.0000 mg | ORAL_TABLET | Freq: Four times a day (QID) | ORAL | Status: DC
  Administered 2016-01-14 – 2016-01-16 (×9): 600 mg via ORAL
  Filled 2016-01-14 (×10): qty 1

## 2016-01-14 MED ORDER — SIMETHICONE 80 MG PO CHEW
80.0000 mg | CHEWABLE_TABLET | Freq: Three times a day (TID) | ORAL | Status: DC
  Administered 2016-01-14 – 2016-01-15 (×5): 80 mg via ORAL
  Filled 2016-01-14 (×6): qty 1

## 2016-01-14 MED ORDER — NALBUPHINE HCL 10 MG/ML IJ SOLN
5.0000 mg | INTRAMUSCULAR | Status: DC | PRN
  Administered 2016-01-14 (×2): 5 mg via INTRAVENOUS
  Filled 2016-01-14 (×2): qty 1

## 2016-01-14 MED ORDER — NALBUPHINE HCL 10 MG/ML IJ SOLN: 5.0000 mg | Freq: Once | INTRAMUSCULAR | Status: DC | PRN

## 2016-01-14 MED ORDER — DEXTROSE 5 % IV SOLN
3.0000 g | INTRAVENOUS | Status: DC
  Filled 2016-01-14: qty 3000

## 2016-01-14 MED ORDER — MORPHINE SULFATE (PF) 0.5 MG/ML IJ SOLN
INTRAMUSCULAR | Status: AC
  Filled 2016-01-14: qty 10

## 2016-01-14 MED ORDER — DIPHENHYDRAMINE HCL 25 MG PO CAPS: 25.0000 mg | ORAL_CAPSULE | ORAL | Status: DC | PRN

## 2016-01-14 MED ORDER — OXYTOCIN 10 UNIT/ML IJ SOLN: 2.5000 [IU]/h | INTRAMUSCULAR | Status: AC

## 2016-01-14 MED ORDER — SCOPOLAMINE 1 MG/3DAYS TD PT72
MEDICATED_PATCH | TRANSDERMAL | Status: AC
  Filled 2016-01-14: qty 1

## 2016-01-14 MED ORDER — WITCH HAZEL-GLYCERIN EX PADS: 1.0000 "application " | MEDICATED_PAD | CUTANEOUS | Status: DC | PRN

## 2016-01-14 MED ORDER — ONDANSETRON HCL 4 MG/2ML IJ SOLN
INTRAMUSCULAR | Status: AC
  Filled 2016-01-14: qty 2

## 2016-01-14 MED ORDER — MENTHOL 3 MG MT LOZG: 1.0000 | LOZENGE | OROMUCOSAL | Status: DC | PRN

## 2016-01-14 MED ORDER — BUPIVACAINE IN DEXTROSE 0.75-8.25 % IT SOLN
INTRATHECAL | Status: DC | PRN
  Administered 2016-01-14: 10.5 mg via INTRATHECAL

## 2016-01-14 MED ORDER — PHENYLEPHRINE 8 MG IN D5W 100 ML (0.08MG/ML) PREMIX OPTIME
INJECTION | INTRAVENOUS | Status: AC
  Filled 2016-01-14: qty 100

## 2016-01-14 MED ORDER — DIPHENHYDRAMINE HCL 25 MG PO CAPS: 25.0000 mg | ORAL_CAPSULE | Freq: Four times a day (QID) | ORAL | Status: DC | PRN

## 2016-01-14 MED ORDER — SENNOSIDES-DOCUSATE SODIUM 8.6-50 MG PO TABS
2.0000 | ORAL_TABLET | ORAL | Status: DC
  Administered 2016-01-14: 2 via ORAL
  Filled 2016-01-14 (×2): qty 2

## 2016-01-14 MED ORDER — LACTATED RINGERS IV SOLN: INTRAVENOUS | Status: DC

## 2016-01-14 MED ORDER — OXYTOCIN 10 UNIT/ML IJ SOLN
40.0000 [IU] | INTRAVENOUS | Status: DC | PRN
  Administered 2016-01-14: 40 [IU] via INTRAVENOUS

## 2016-01-14 MED ORDER — PHENYLEPHRINE 8 MG IN D5W 100 ML (0.08MG/ML) PREMIX OPTIME
INJECTION | INTRAVENOUS | Status: DC | PRN
  Administered 2016-01-14: 60 ug/min via INTRAVENOUS

## 2016-01-14 MED ORDER — KETOROLAC TROMETHAMINE 30 MG/ML IJ SOLN: 30.0000 mg | Freq: Four times a day (QID) | INTRAMUSCULAR | Status: AC | PRN

## 2016-01-14 MED ORDER — FENTANYL CITRATE (PF) 100 MCG/2ML IJ SOLN
INTRAMUSCULAR | Status: AC
Start: 2016-01-14 — End: 2016-01-14
  Filled 2016-01-14: qty 2

## 2016-01-14 MED ORDER — SCOPOLAMINE 1 MG/3DAYS TD PT72
MEDICATED_PATCH | TRANSDERMAL | Status: DC | PRN
  Administered 2016-01-14: 1 via TRANSDERMAL

## 2016-01-14 MED ORDER — SCOPOLAMINE 1 MG/3DAYS TD PT72
1.0000 | MEDICATED_PATCH | Freq: Once | TRANSDERMAL | Status: DC
Start: 2016-01-14 — End: 2016-01-16
  Filled 2016-01-14: qty 1

## 2016-01-14 MED ORDER — DEXTROSE 5 % IV SOLN
2.0000 g | INTRAVENOUS | Status: DC | PRN
  Administered 2016-01-14: 2 g via INTRAVENOUS

## 2016-01-14 MED ORDER — PRENATAL MULTIVITAMIN CH
1.0000 | ORAL_TABLET | Freq: Every day | ORAL | Status: DC
  Administered 2016-01-14 – 2016-01-15 (×2): 1 via ORAL
  Filled 2016-01-14 (×3): qty 1

## 2016-01-14 MED ORDER — LANOLIN HYDROUS EX OINT
1.0000 | TOPICAL_OINTMENT | CUTANEOUS | Status: DC | PRN
Start: 2016-01-14 — End: 2016-01-16

## 2016-01-14 MED ORDER — ZOLPIDEM TARTRATE 5 MG PO TABS: 5.0000 mg | ORAL_TABLET | Freq: Every evening | ORAL | Status: DC | PRN

## 2016-01-14 MED ORDER — DIBUCAINE 1 % RE OINT: 1.0000 "application " | TOPICAL_OINTMENT | RECTAL | Status: DC | PRN

## 2016-01-14 MED ORDER — SODIUM CHLORIDE 0.9 % IJ SOLN: 3.0000 mL | INTRAMUSCULAR | Status: DC | PRN

## 2016-01-14 MED ORDER — NALOXONE HCL 2 MG/2ML IJ SOSY
1.0000 ug/kg/h | PREFILLED_SYRINGE | INTRAVENOUS | Status: DC | PRN
  Filled 2016-01-14: qty 2

## 2016-01-14 MED ORDER — NALBUPHINE HCL 10 MG/ML IJ SOLN: 5.0000 mg | INTRAMUSCULAR | Status: DC | PRN

## 2016-01-14 MED ORDER — ONDANSETRON HCL 4 MG/2ML IJ SOLN
INTRAMUSCULAR | Status: DC | PRN
  Administered 2016-01-14: 4 mg via INTRAVENOUS

## 2016-01-14 MED ORDER — TETANUS-DIPHTH-ACELL PERTUSSIS 5-2.5-18.5 LF-MCG/0.5 IM SUSP: 0.5000 mL | Freq: Once | INTRAMUSCULAR | Status: DC

## 2016-01-14 MED ORDER — ONDANSETRON HCL 4 MG/2ML IJ SOLN: 4.0000 mg | Freq: Three times a day (TID) | INTRAMUSCULAR | Status: DC | PRN

## 2016-01-14 MED ORDER — LACTATED RINGERS IV SOLN
INTRAVENOUS | Status: DC | PRN
  Administered 2016-01-14: 01:00:00 via INTRAVENOUS

## 2016-01-14 MED ORDER — FENTANYL CITRATE (PF) 100 MCG/2ML IJ SOLN: 25.0000 ug | INTRAMUSCULAR | Status: DC | PRN

## 2016-01-14 MED ORDER — MEPERIDINE HCL 25 MG/ML IJ SOLN
6.2500 mg | INTRAMUSCULAR | Status: DC | PRN
Start: 2016-01-14 — End: 2016-01-14

## 2016-01-14 MED ORDER — OXYTOCIN 10 UNIT/ML IJ SOLN
INTRAMUSCULAR | Status: AC
  Filled 2016-01-14: qty 4

## 2016-01-14 MED ORDER — ACETAMINOPHEN 325 MG PO TABS: 650.0000 mg | ORAL_TABLET | ORAL | Status: DC | PRN

## 2016-01-14 MED ORDER — NALOXONE HCL 0.4 MG/ML IJ SOLN: 0.4000 mg | INTRAMUSCULAR | Status: DC | PRN

## 2016-01-14 NOTE — Anesthesia Postprocedure Evaluation (Signed)
Anesthesia Post Note  Patient: Lauren Melton  Procedure(s) Performed: Procedure(s) (LRB): CESAREAN SECTION (N/A)  Patient location during evaluation: Mother Baby Anesthesia Type: Epidural Level of consciousness: awake and alert Pain management: pain level controlled Vital Signs Assessment: post-procedure vital signs reviewed and stable Respiratory status: spontaneous breathing Cardiovascular status: stable Postop Assessment: no headache, no backache, no signs of nausea or vomiting and adequate PO intake Anesthetic complications: no    Last Vitals:  Filed Vitals:   01/14/16 0508 01/14/16 0608  BP: 134/67 129/68  Pulse: 82 89  Temp: 36.7 C 36.9 C  Resp: 21     Last Pain:  Filed Vitals:   01/14/16 0637  PainSc: 0-No pain                 Amaurie Wandel Hristova

## 2016-01-14 NOTE — Lactation Note (Signed)
This note was copied from the chart of [place]. Lactation Consultation Note; Initial visit with mom, baby now 54 hours old. Mom states she is about to give up- reports that baby is getting his hands in the way and he is taking some time to get latched. Encouragement given that he is learning and to keep trying. Reports no pain with latch that she feels only tugging. Reports she fed for 10 min about 30 min ago. Baby is asleep in bassinet at this time. Baby had a 30 min feeding earlier today. No questions at present. BF brochure given with resources for support after DC. To call for assist prn  Patient Name: [name] [id number] Date: 01/14/2016 Reason for consult: Initial assessment   Maternal Data Formula Feeding for Exclusion: Yes Reason for exclusion: Mother's choice to formula and breast feed on admission Does the patient have breastfeeding experience prior to this delivery?: No  Feeding Feeding Type: Breast Fed Length of feed: 5 min  LATCH Score/Interventions                      Lactation Tools Discussed/Used     Consult Status Consult Status: Follow-up Date: 01/14/16 Follow-up type: In-patient    Truddie Crumble 01/14/2016, 11:46 AM

## 2016-01-14 NOTE — Progress Notes (Signed)
Subjective: Postpartum Day DOS: Cesarean Delivery Patient reports tolerating PO.    Objective: Vital signs in last 24 hours: Temp:  [98.1 F (36.7 C)-99 F (37.2 C)] 98.4 F (36.9 C) (01/16 [id number]) Pulse Rate:  [76-89] 89 (01/16 0608) Resp:  [18-23] 21 (01/16 0508) BP: (129-151)/(67-102) 129/68 mmHg (01/16 0608) SpO2:  [97 %-100 %] 97 % (01/16 [id number]) Weight:  [302 lb (136.986 kg)] 302 lb (136.986 kg) (01/15 2308)  Physical Exam:  General: alert, cooperative and no distress Lochia: appropriate Uterine Fundus: firm Incision: healing well DVT Evaluation: No evidence of DVT seen on physical exam.   Recent Labs  01/13/16 2355 01/14/16 0525  HGB 10.8* 9.3*  HCT 31.2* 27.6*    Assessment/Plan: Status post Cesarean section. Doing well postoperatively.  Continue current care.  Lauren Melton,Lauren Melton E 01/14/2016, 9:08 AM

## 2016-01-14 NOTE — Op Note (Signed)
Cesarean Section Procedure Note  Pre-operative Diagnosis: IUP at 39+ weeks, Labor, hx HSV, Pt requests C/S  Post-operative Diagnosis: same  Surgeon: Luz Lex   Assistants: none  Anesthesia: Spinal  Procedure:  Low Segment Transverse cesarean section  Procedure Details  The patient was seen in the Holding Room. The risks, benefits, complications, treatment options, and expected outcomes were discussed with the patient.  The patient concurred with the proposed plan, giving informed consent.  The site of surgery properly noted/marked.. A Time Out was held and the above information confirmed.  After induction of anesthesia, the patient was draped and prepped in the usual sterile manner. A Pfannenstiel incision was made and carried down through the subcutaneous tissue to the fascia. Fascial incision was made and extended transversely. The fascia was separated from the underlying rectus tissue superiorly and inferiorly. The peritoneum was identified and entered. Peritoneal incision was extended longitudinally. The utero-vesical peritoneal reflection was incised transversely and the bladder flap was bluntly freed from the lower uterine segment. A low transverse uterine incision was made. Delivered from vertex presentation was a baby with Apgar scores of 8 at one minute and 9 at five minutes. After the umbilical cord was clamped and cut cord blood was obtained for evaluation. The placenta was removed intact and appeared normal. The uterine outline, tubes and ovaries appeared normal. The uterine incision was closed with running locked sutures of 0 monocryl and imbricated with 0 monocryl. Hemostasis was observed. Lavage was carried out until clear. The peritoneum was then closed with 0 monocryl and rectus muscles plicated in the midline.  After hemostasis was assured, the fascia was then reapproximated with running sutures of 0 PDS. Irrigation was applied and after adequate hemostasis was assured, the  skin was reapproximated with subcutaneous sutures using 4-0 monocryl.  Instrument, sponge, and needle counts were correct prior the abdominal closure and at the conclusion of the case. The patient received 2 grams cefotetan preoperatively.  Findings: Viable female  Estimated Blood Loss:  700cc         Specimens: Placenta was sent to labor and delivery         Complications:  None

## 2016-01-14 NOTE — Anesthesia Postprocedure Evaluation (Signed)
Anesthesia Post Note  Patient: Lauren Melton  Procedure(s) Performed: Procedure(s) (LRB): CESAREAN SECTION (N/A)  Patient location during evaluation: PACU Anesthesia Type: Spinal Level of consciousness: oriented and awake and alert Pain management: pain level controlled Vital Signs Assessment: post-procedure vital signs reviewed and stable Respiratory status: spontaneous breathing and respiratory function stable Cardiovascular status: blood pressure returned to baseline and stable Postop Assessment: no headache and no backache Anesthetic complications: no    Last Vitals:  Filed Vitals:   01/14/16 0300 01/14/16 0308  BP: 146/84 135/94  Pulse: 81 81  Temp:  36.7 C  Resp: 22 20    Last Pain:  Filed Vitals:   01/14/16 0311  PainSc: 0-No pain                 Montez Hageman

## 2016-01-14 NOTE — Anesthesia Procedure Notes (Signed)
Spinal Patient location during procedure: OR Staffing Anesthesiologist: Montez Hageman Performed by: anesthesiologist  Preanesthetic Checklist Completed: patient identified, site marked, surgical consent, pre-op evaluation, timeout performed, IV checked, risks and benefits discussed and monitors and equipment checked Spinal Block Patient position: sitting Prep: Betadine Patient monitoring: heart rate, continuous pulse ox and blood pressure Approach: midline Location: L4-5 Injection technique: single-shot Needle Needle type: Sprotte  Needle gauge: 24 G Needle length: 9 cm Additional Notes Expiration date of kit checked and confirmed. Patient tolerated procedure well, without complications.

## 2016-01-14 NOTE — Transfer of Care (Signed)
Immediate Anesthesia Transfer of Care Note  Patient: Lauren Melton  Procedure(s) Performed: Procedure(s): CESAREAN SECTION (N/A)  Patient Location: PACU  Anesthesia Type:Spinal  Level of Consciousness: awake  Airway & Oxygen Therapy: Patient Spontanous Breathing  Post-op Assessment: Report given to RN and Post -op Vital signs reviewed and stable  Post vital signs: stable  Last Vitals:  Filed Vitals:   01/13/16 2308  BP: 149/86  Pulse: 87  Temp: 36.8 C  Resp: 18    Complications: No apparent anesthesia complications

## 2016-01-14 NOTE — Addendum Note (Signed)
Addendum  created 01/14/16 0851 by Hewitt Blade, CRNA   Modules edited: Clinical Notes   Clinical Notes:  File: [id number]

## 2016-01-14 NOTE — Anesthesia Preprocedure Evaluation (Signed)
Anesthesia Evaluation  Patient identified by MRN, date of birth, ID band Patient awake    Reviewed: Allergy & Precautions, NPO status , Patient's Chart, lab work & pertinent test results  Airway Mallampati: II  TM Distance: >3 FB Neck ROM: Full    Dental no notable dental hx.    Pulmonary neg pulmonary ROS,    Pulmonary exam normal breath sounds clear to auscultation       Cardiovascular hypertension, negative cardio ROS Normal cardiovascular exam Rhythm:Regular Rate:Normal     Neuro/Psych negative neurological ROS  negative psych ROS   GI/Hepatic negative GI ROS, Neg liver ROS,   Endo/Other  Morbid obesity  Renal/GU negative Renal ROS  negative genitourinary   Musculoskeletal negative musculoskeletal ROS (+)   Abdominal   Peds negative pediatric ROS (+)  Hematology negative hematology ROS (+)   Anesthesia Other Findings   Reproductive/Obstetrics (+) Pregnancy                             Anesthesia Physical Anesthesia Plan  ASA: III  Anesthesia Plan: Spinal   Post-op Pain Management:    Induction:   Airway Management Planned: Natural Airway  Additional Equipment:   Intra-op Plan:   Post-operative Plan:   Informed Consent: I have reviewed the patients History and Physical, chart, labs and discussed the procedure including the risks, benefits and alternatives for the proposed anesthesia with the patient or authorized representative who has indicated his/her understanding and acceptance.   Dental advisory given  Plan Discussed with: CRNA  Anesthesia Plan Comments:         Anesthesia Quick Evaluation

## 2016-01-15 ENCOUNTER — Encounter (HOSPITAL_COMMUNITY): Payer: Self-pay | Admitting: Obstetrics and Gynecology

## 2016-01-15 LAB — BIRTH TISSUE RECOVERY COLLECTION (PLACENTA DONATION)

## 2016-01-15 NOTE — Lactation Note (Signed)
This note was copied from the chart of [place]. Lactation Consultation Note  Patient Name: Boy Dnia Streight [id number] Date: 01/15/2016 Reason for consult: Follow-up assessment   Follow up with mom of 46 hour old infant. Infant with 2 BF attempts for 5 minutes and 5 bottle feeds of 7-35 cc, 2 voids and 2 stools in last 24 hours. Infant weight 8 lb 10.8 oz with no change in weight in first 12 hours of life. Mom reports infant was circumcised this morning and has not fed since 3 am. She reports she has given bottles and would like to get infant back to breast. Mom with large fleshy pendulous breasts, nipples are semi flat and become erect with manual stimulation, areolas are easily compressible. Several gtts of Colostrum was easily expressed from right side. Mom was attempting to latch infant to right breast in football hold, She had infant in good position. Infant awake and did not want to open mouth and noted to be tongue sucking. After assisting mom for a few minutes infant latched to right breast. He maintained suckling and a few intermittent swallows were heard. Mom denies pain and did well with keeping infant stimulated during feeding. Enc mom to BF at least 8-12 x in 24 hours at first feeding cues and if formula given to give after BF. Discussed supply and demand stomach size, NB Nutritional leeds, NL NB Feeding behavior, cluster feeding and milk coming to volume. and need for frequent stimulation to breast to encourage milk to come in. Advised mom that if formula to be given, give post BF and reviewed appropriate amounts based on infants age. Mom has a Ped appt scheduled for Friday for infant. Mom is planning to apply for [facility] after d/c. Enc mom to call out for assistance with latch as needed. Mom voiced understanding to all teaching.    Maternal Data Formula Feeding for Exclusion: No Does the patient have breastfeeding experience prior to this delivery?: No  Feeding Feeding  Type: Breast Fed  LATCH Score/Interventions Latch: Grasps breast easily, tongue down, lips flanged, rhythmical sucking. Intervention(s): Adjust position;Assist with latch;Breast massage;Breast compression  Audible Swallowing: A few with stimulation Intervention(s): Skin to skin  Type of Nipple: Flat (everts with stimulation) Intervention(s): No intervention needed  Comfort (Breast/Nipple): Soft / non-tender     Hold (Positioning): Assistance needed to correctly position infant at breast and maintain latch. Intervention(s): Breastfeeding basics reviewed;Support Pillows;Position options;Skin to skin  LATCH Score: 7  Lactation Tools Discussed/Used WIC Program:  (Plans to apply)   Consult Status Consult Status: Follow-up Date: 01/16/16 Follow-up type: In-patient    Debby Freiberg Sarh Kirschenbaum 01/15/2016, 12:48 PM

## 2016-01-15 NOTE — Progress Notes (Signed)
Patient doing well. No complaints.  BP 144/69 mmHg  Pulse 100  Temp(Src) 98 F (36.7 C) (Oral)  Resp 18  Ht 5\' 6"  (1.676 m)  Wt 302 lb (136.986 kg)  BMI 48.77 kg/m2  SpO2 100%  LMP 04/13/2015  Breastfeeding? Unknown Results for orders placed or performed during the hospital encounter of 01/13/16 (from the past 24 hour(s))  Collect bld for placenta donatation     Status: None   Collection Time: 01/15/16  6:07 AM  Result Value Ref Range   Placenta donation bld collect COLLECTED BY LABORATORY    Afebrile  Vital signs stable Abdomen is soft and non tender Incision is clean dry and intact  IMPRESSION: POD #1 Doing well Routine care circ today for baby

## 2016-01-16 ENCOUNTER — Inpatient Hospital Stay (HOSPITAL_COMMUNITY)
Admission: RE | Admit: 2016-01-16 | Payer: BLUE CROSS/BLUE SHIELD | Source: Ambulatory Visit | Admitting: Obstetrics and Gynecology

## 2016-01-16 ENCOUNTER — Encounter (HOSPITAL_COMMUNITY): Admission: RE | Payer: Self-pay | Source: Ambulatory Visit

## 2016-01-16 SURGERY — Surgical Case
Anesthesia: Regional

## 2016-01-16 MED ORDER — IBUPROFEN 600 MG PO TABS: 600.0000 mg | ORAL_TABLET | Freq: Four times a day (QID) | ORAL | Status: DC

## 2016-01-16 NOTE — Discharge Summary (Signed)
Obstetric Discharge Summary Reason for Admission: onset of labor Prenatal Procedures: ultrasound Intrapartum Procedures: cesarean: low cervical, transverse Postpartum Procedures: none Complications-Operative and Postpartum: none HEMOGLOBIN  Date Value Ref Range Status  01/14/2016 9.3* 12.0 - 15.0 g/dL Final   HCT  Date Value Ref Range Status  01/14/2016 27.6* 36.0 - 46.0 % Final    Physical Exam:  General: alert and cooperative Lochia: appropriate Uterine Fundus: firm Incision: no significant drainage DVT Evaluation: No evidence of DVT seen on physical exam.  Discharge Diagnoses: Term Pregnancy-delivered  Discharge Information: Date: 01/16/2016 Activity: pelvic rest Diet: routine Medications: PNV and Ibuprofen Condition: stable Instructions: refer to practice specific booklet Discharge to: home Follow-up Information    Schedule an appointment as soon as possible for a visit in 2 weeks to follow up.      Newborn Data: Live born female  Birth Weight: 8 lb 10.8 oz (3935 g) APGAR: 9, 9  Home with mother.  Lauren Melton 01/16/2016, 8:36 AM

## 2016-07-02 DIAGNOSIS — N390 Urinary tract infection, site not specified: Secondary | ICD-10-CM | POA: Diagnosis not present

## 2016-07-02 DIAGNOSIS — Z3200 Encounter for pregnancy test, result unknown: Secondary | ICD-10-CM | POA: Diagnosis not present

## 2016-07-02 DIAGNOSIS — Z113 Encounter for screening for infections with a predominantly sexual mode of transmission: Secondary | ICD-10-CM | POA: Diagnosis not present

## 2016-07-02 DIAGNOSIS — R3915 Urgency of urination: Secondary | ICD-10-CM | POA: Diagnosis not present

## 2016-07-02 DIAGNOSIS — N760 Acute vaginitis: Secondary | ICD-10-CM | POA: Diagnosis not present

## 2016-07-16 DIAGNOSIS — R102 Pelvic and perineal pain: Secondary | ICD-10-CM | POA: Diagnosis not present

## 2016-07-16 DIAGNOSIS — N760 Acute vaginitis: Secondary | ICD-10-CM | POA: Diagnosis not present

## 2016-08-13 ENCOUNTER — Ambulatory Visit (INDEPENDENT_AMBULATORY_CARE_PROVIDER_SITE_OTHER): Payer: BLUE CROSS/BLUE SHIELD | Admitting: Physician Assistant

## 2016-08-13 ENCOUNTER — Encounter: Payer: Self-pay | Admitting: Physician Assistant

## 2016-08-13 VITALS — BP 132/69 | HR 61 | Ht 67.0 in | Wt 293.0 lb

## 2016-08-13 DIAGNOSIS — Z86011 Personal history of benign neoplasm of the brain: Secondary | ICD-10-CM | POA: Insufficient documentation

## 2016-08-13 DIAGNOSIS — J309 Allergic rhinitis, unspecified: Secondary | ICD-10-CM

## 2016-08-13 DIAGNOSIS — I10 Essential (primary) hypertension: Secondary | ICD-10-CM

## 2016-08-13 MED ORDER — METHYLPREDNISOLONE 4 MG PO TBPK: ORAL_TABLET | ORAL | 0 refills | Status: DC

## 2016-08-13 MED ORDER — KETOROLAC TROMETHAMINE 60 MG/2ML IM SOLN
60.0000 mg | Freq: Once | INTRAMUSCULAR | Status: AC
  Administered 2016-08-13: 60 mg via INTRAMUSCULAR

## 2016-08-13 NOTE — Progress Notes (Signed)
   Subjective:    Patient ID: Lauren Melton, female    DOB: January 15, 1986, 30 y.o.   MRN: [id number]  HPI  Patient is a 30 year old female who presents to the clinic to establish care.  .. Active Ambulatory Problems    Diagnosis Date Noted  . Cesarean delivery delivered 01/14/2016  . Essential hypertension, benign 08/13/2016  . History of benign brain tumor 08/13/2016   Resolved Ambulatory Problems    Diagnosis Date Noted  . No Resolved Ambulatory Problems   Past Medical History:  Diagnosis Date  . HSV-2 seropositive   . Hypertension    .[name] Kitchen Family History  Problem Relation Age of Onset  . Arthritis Father   . Hypertension Father   . Cancer Maternal Aunt   . Diabetes Paternal Aunt   . Hypertension Paternal Aunt   . Cancer Maternal Grandfather   . Hypertension Paternal Grandmother   . Cancer Paternal Grandfather   . Diabetes Paternal Aunt   . Hypertension Paternal Aunt    .[name] Kitchen Social History   Social History  . Marital status: Single    Spouse name: N/A  . Number of children: N/A  . Years of education: N/A   Occupational History  . Not on file.   Social History Main Topics  . Smoking status: Never Smoker  . Smokeless tobacco: Never Used  . Alcohol use No  . Drug use: No  . Sexual activity: Yes   Other Topics Concern  . Not on file   Social History Narrative  . No narrative on file   Patient's main concern today is a headache that has been ongoing for the last 3-4 days. She does not have a history of migraines or headaches. She denies any nausea, vision changes, aura, photophobia or phono sensitivity. She has noticed a sore throat and sinus pressure with some ear pain. She also has some sinus drainage.  She has taken ibuprofen over-the-counter with some relief. She denies any fever, chills, cough, wheezing or shortness of breath. 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 It all started with a  headache and that is all she is concerned.     Review of Systems    see HPI.  Objective:   Physical Exam  Constitutional: She is oriented to person, place, and time. She appears well-developed and well-nourished.  Obesity.   HENT:  Head: Normocephalic and atraumatic.  TM"s clear bilaterally.  Tenderness over bilateral temples and maxillary sinuses.  Bilateral nasal turbinates red and swollen.  PND present on oropharynx.   Eyes: Conjunctivae and EOM are normal. Pupils are equal, round, and reactive to light. Right eye exhibits no discharge. Left eye exhibits no discharge.  Neck: Normal range of motion. Neck supple. No thyromegaly present.  Pulmonary/Chest: Effort normal and breath sounds normal.  Lymphadenopathy:    She has no cervical adenopathy.  Neurological: She is alert and oriented to person, place, and time.  Psychiatric: She has a normal mood and affect. Her behavior is normal.          Assessment & Plan:  Allergic sinusitis- Toradol 60mg  IM given for headache component today. Medrol dose pak given to start. Ibuprofen as needed. flonase 2 sprays each nostril. Start zyrtec daily after medrol dose pack.   HTN- controlled today. Does not need any refills today.   Make CPE appt.

## 2016-08-13 NOTE — Patient Instructions (Signed)
Start medrol dose pak.  Consider flonase nasal spray.  Consider zyrtec daily after finishing prednisone.  Call Friday if not better.

## 2016-08-27 ENCOUNTER — Encounter: Payer: Self-pay | Admitting: Physician Assistant

## 2016-08-27 ENCOUNTER — Ambulatory Visit (INDEPENDENT_AMBULATORY_CARE_PROVIDER_SITE_OTHER): Payer: BLUE CROSS/BLUE SHIELD | Admitting: Physician Assistant

## 2016-08-27 VITALS — BP 128/67 | HR 63 | Ht 67.0 in | Wt 288.0 lb

## 2016-08-27 DIAGNOSIS — R519 Headache, unspecified: Secondary | ICD-10-CM

## 2016-08-27 DIAGNOSIS — M26609 Unspecified temporomandibular joint disorder, unspecified side: Secondary | ICD-10-CM

## 2016-08-27 DIAGNOSIS — Z0000 Encounter for general adult medical examination without abnormal findings: Secondary | ICD-10-CM

## 2016-08-27 DIAGNOSIS — R5383 Other fatigue: Secondary | ICD-10-CM

## 2016-08-27 DIAGNOSIS — Z131 Encounter for screening for diabetes mellitus: Secondary | ICD-10-CM

## 2016-08-27 DIAGNOSIS — Z13220 Encounter for screening for lipoid disorders: Secondary | ICD-10-CM

## 2016-08-27 DIAGNOSIS — R51 Headache: Secondary | ICD-10-CM

## 2016-08-27 DIAGNOSIS — Z113 Encounter for screening for infections with a predominantly sexual mode of transmission: Secondary | ICD-10-CM

## 2016-08-27 DIAGNOSIS — J0100 Acute maxillary sinusitis, unspecified: Secondary | ICD-10-CM

## 2016-08-27 LAB — VITAMIN B12: Vitamin B-12: 378 pg/mL (ref 200–1100)

## 2016-08-27 LAB — COMPLETE METABOLIC PANEL WITH GFR
ALBUMIN: 4.2 g/dL (ref 3.6–5.1)
ALK PHOS: 67 U/L (ref 33–115)
ALT: 18 U/L (ref 6–29)
AST: 12 U/L (ref 10–30)
BUN: 9 mg/dL (ref 7–25)
CO2: 22 mmol/L (ref 20–31)
Calcium: 9.6 mg/dL (ref 8.6–10.2)
Chloride: 105 mmol/L (ref 98–110)
Creat: 0.75 mg/dL (ref 0.50–1.10)
GLUCOSE: 90 mg/dL (ref 65–99)
POTASSIUM: 4.4 mmol/L (ref 3.5–5.3)
SODIUM: 138 mmol/L (ref 135–146)
Total Bilirubin: 0.4 mg/dL (ref 0.2–1.2)
Total Protein: 7.6 g/dL (ref 6.1–8.1)

## 2016-08-27 LAB — LIPID PANEL
CHOL/HDL RATIO: 5.7 ratio — AB (ref <=5.0)
Cholesterol: 195 mg/dL (ref 125–200)
HDL: 34 mg/dL — AB (ref >=46)
LDL Cholesterol: 136 mg/dL — ABNORMAL HIGH (ref <130)
TRIGLYCERIDES: 125 mg/dL (ref <150)
VLDL: 25 mg/dL (ref <30)

## 2016-08-27 LAB — TSH: TSH: 1.26 m[IU]/L

## 2016-08-27 LAB — HIV ANTIBODY (ROUTINE TESTING W REFLEX): HIV 1&2 Ab, 4th Generation: NONREACTIVE

## 2016-08-27 MED ORDER — BISOPROLOL-HYDROCHLOROTHIAZIDE 5-6.25 MG PO TABS: 1.0000 | ORAL_TABLET | Freq: Every day | ORAL | 3 refills | Status: DC

## 2016-08-27 MED ORDER — AMOXICILLIN-POT CLAVULANATE 875-125 MG PO TABS: 1.0000 | ORAL_TABLET | Freq: Two times a day (BID) | ORAL | 0 refills | Status: DC

## 2016-08-27 NOTE — Progress Notes (Signed)
Subjective:     Lauren Melton is a 30 y.o. female and is here for a comprehensive physical exam. The patient reports problems - Patient continues to have a lot of sinus pressure and right ear and jaw pain. She was seen approximately a week ago and given prednisone, Flonase and Zyrtec. She has not been taking the Zyrtec. She does report she was some better after finishing the prednisone but the symptoms came back. She denies any fever, chills, shortness of breath or wheezing. She is having some sore throat and she feels like her glands are swollen at times. She admits to clenching her  teeth. She is having a lot of right TMJ discomfort. She reports headaches that have increased the last week and a half. Her energy level is much decreased..  Social History   Social History  . Marital status: Single    Spouse name: N/A  . Number of children: N/A  . Years of education: N/A   Occupational History  . Not on file.   Social History Main Topics  . Smoking status: Never Smoker  . Smokeless tobacco: Never Used  . Alcohol use No  . Drug use: No  . Sexual activity: Yes   Other Topics Concern  . Not on file   Social History Narrative  . No narrative on file   Health Maintenance  Topic Date Due  . PAP SMEAR  01/07/2006  . INFLUENZA VACCINE  08/27/2017 (Originally 07/29/2016)  . TETANUS/TDAP  09/28/2025  . HIV Screening  Completed    The following portions of the patient's history were reviewed and updated as appropriate: allergies, current medications, past family history, past medical history, past social history, past surgical history and problem list.  Review of Systems Pertinent items noted in HPI and remainder of comprehensive ROS otherwise negative.   Objective:    BP 128/67   Pulse 63   Ht 5\' 7"  (1.702 m)   Wt 288 lb (130.6 kg)   BMI 45.11 kg/m  General appearance: alert, cooperative, appears stated age and morbidly obese Head: Normocephalic, without obvious abnormality,  atraumatic Eyes: conjunctivae/corneas clear. PERRL, EOM's intact. Fundi benign. Ears: normal TM's and external ear canals both ears Nose: Nares normal. Septum midline. Mucosa normal. No drainage or sinus tenderness. Throat: lips, mucosa, and tongue normal; teeth and gums normal Neck: no adenopathy, no carotid bruit, no JVD, supple, symmetrical, trachea midline and thyroid not enlarged, symmetric, no tenderness/mass/nodules Back: symmetric, no curvature. ROM normal. No CVA tenderness. Lungs: clear to auscultation bilaterally Breasts: normal appearance, no masses or tenderness Heart: regular rate and rhythm, S1, S2 normal, no murmur, click, rub or gallop Abdomen: soft, non-tender; bowel sounds normal; no masses,  no organomegaly Extremities: extremities normal, atraumatic, no cyanosis or edema Pulses: 2+ and symmetric Skin: Skin color, texture, turgor normal. No rashes or lesions Lymph nodes: Cervical, supraclavicular, and axillary nodes normal. Neurologic: Alert and oriented X 3, normal strength and tone. Normal symmetric reflexes. Normal coordination and gait    Assessment:    Healthy female exam.      Plan:    CPE- pt declined flu shot. HIV, hepatitis panel and RPR ordered for STI testing. Declined GC/Chlamydia. Lipid, cmp, TSH ordered.   Morbid obesity- discussed need to lose weight and exercise. Come back for another visit to discuss in full.   No energy- Fatigue panel done. Could be sinus infection she is fighting. Certainly need to lose weight to help with overall energy.   TMJ, right/frequent headaches/acute  maxillary sinusitis- TMJ HO given. Discussed symptomatic care. Encouraged ibuprofen as needed. Will treat with Augmentin for 10 days for sinus infection. Continue Flonase. They could be some eustachian tube dysfunction as well. Follow up if not improving.   See After Visit Summary for Counseling Recommendations

## 2016-08-27 NOTE — Patient Instructions (Signed)

## 2016-08-28 LAB — HEPATITIS PANEL, ACUTE
HCV AB: NEGATIVE
HEP B C IGM: NONREACTIVE
HEP B S AG: NEGATIVE
Hep A IgM: NONREACTIVE

## 2016-08-28 LAB — VITAMIN D 25 HYDROXY (VIT D DEFICIENCY, FRACTURES): Vit D, 25-Hydroxy: 20 ng/mL — ABNORMAL LOW (ref 30–100)

## 2016-08-28 LAB — RPR

## 2016-08-29 ENCOUNTER — Encounter: Payer: Self-pay | Admitting: Physician Assistant

## 2016-08-29 DIAGNOSIS — E559 Vitamin D deficiency, unspecified: Secondary | ICD-10-CM | POA: Insufficient documentation

## 2016-12-08 ENCOUNTER — Encounter: Payer: Self-pay | Admitting: Family Medicine

## 2016-12-08 ENCOUNTER — Ambulatory Visit (INDEPENDENT_AMBULATORY_CARE_PROVIDER_SITE_OTHER): Payer: BLUE CROSS/BLUE SHIELD | Admitting: Family Medicine

## 2016-12-08 VITALS — BP 127/67 | HR 81 | Ht 67.0 in | Wt 294.0 lb

## 2016-12-08 DIAGNOSIS — R300 Dysuria: Secondary | ICD-10-CM

## 2016-12-08 DIAGNOSIS — N760 Acute vaginitis: Secondary | ICD-10-CM

## 2016-12-08 LAB — WET PREP, GENITAL
TRICH WET PREP: NONE SEEN
YEAST WET PREP: NONE SEEN

## 2016-12-08 LAB — POCT URINALYSIS DIPSTICK
Bilirubin, UA: NEGATIVE
Blood, UA: NEGATIVE
Glucose, UA: NEGATIVE
Ketones, UA: NEGATIVE
LEUKOCYTES UA: NEGATIVE
NITRITE UA: NEGATIVE
PH UA: 6
PROTEIN UA: NEGATIVE
Spec Grav, UA: 1.01
Urobilinogen, UA: 0.2

## 2016-12-08 MED ORDER — METRONIDAZOLE 500 MG PO TABS: 500.0000 mg | ORAL_TABLET | Freq: Two times a day (BID) | ORAL | 0 refills | Status: DC

## 2016-12-08 NOTE — Addendum Note (Signed)
Addended by: Beatrice Lecher D on: 12/08/2016 05:24 PM   Modules accepted: Orders

## 2016-12-08 NOTE — Progress Notes (Signed)
   Subjective:    Patient ID: Lauren Melton, female    DOB: Dec 21, 1986, 30 y.o.   MRN: [id number]  HPI 6 days of foul smelling urine x 6 days. She has pain after urination and low back pain.  Tried inc water and cranberry juice intake.  She is prone to BV and wants that checked as well. She says when she does get BV it does cause a lot of discomfort and pain around the vaginal area. She is sexually active and says the condom broke. No fever or pelvic cramping.  She has a 51 month old.     Review of Systems     Objective:   Physical Exam  Constitutional: She is oriented to person, place, and time. She appears well-developed and well-nourished.  HENT:  Head: Normocephalic and atraumatic.  Cardiovascular: Normal rate, regular rhythm and normal heart sounds.   Pulmonary/Chest: Effort normal and breath sounds normal.  Musculoskeletal:  No CVA tenderness  Neurological: She is alert and oriented to person, place, and time.  Skin: Skin is warm and dry.  Psychiatric: She has a normal mood and affect. Her behavior is normal.          Assessment & Plan:  Dysuria  - will send UA for culture As the dipstick was negative. She also reports that she did a dipstick in the doctor's office that she works out in a was negative they are as well.  Vaginitis - will do a wet prep to evaluate for yeast versus BV.  Sexually active-recent condom break-we'll have her do a first morning urine to check for gonorrhea and chlamydia. If symptoms persist and please return for pelvic exam. She does have an OB/GYN and her Pap smears up-to-date.

## 2016-12-09 LAB — URINE CULTURE: ORGANISM ID, BACTERIA: NO GROWTH

## 2016-12-10 ENCOUNTER — Telehealth: Payer: Self-pay | Admitting: Physician Assistant

## 2016-12-10 LAB — GC/CHLAMYDIA PROBE AMP
CT PROBE, AMP APTIMA: NOT DETECTED
GC Probe RNA: NOT DETECTED

## 2016-12-10 NOTE — Telephone Encounter (Signed)
Pt returned clinic call regarding results. Went over all results in detail, no further questions. Pt does state she started antibiotic last night and so far has not noticed any change. Advised to take both of today's doses and contact clinic tomorrow if still no relief. Verbalized understanding.

## 2017-06-17 ENCOUNTER — Encounter: Payer: Self-pay | Admitting: Physician Assistant

## 2017-06-17 ENCOUNTER — Ambulatory Visit (INDEPENDENT_AMBULATORY_CARE_PROVIDER_SITE_OTHER): Payer: BLUE CROSS/BLUE SHIELD | Admitting: Physician Assistant

## 2017-06-17 ENCOUNTER — Other Ambulatory Visit (HOSPITAL_COMMUNITY)
Admission: RE | Admit: 2017-06-17 | Discharge: 2017-06-17 | Disposition: A | Discharge status: Home / Self Care | Payer: BLUE CROSS/BLUE SHIELD | Source: Ambulatory Visit | Attending: Physician Assistant | Admitting: Physician Assistant

## 2017-06-17 VITALS — BP 131/79 | HR 74 | Ht 67.0 in | Wt 287.0 lb

## 2017-06-17 DIAGNOSIS — Z113 Encounter for screening for infections with a predominantly sexual mode of transmission: Secondary | ICD-10-CM | POA: Diagnosis not present

## 2017-06-17 DIAGNOSIS — Z131 Encounter for screening for diabetes mellitus: Secondary | ICD-10-CM | POA: Diagnosis not present

## 2017-06-17 DIAGNOSIS — Z13220 Encounter for screening for lipoid disorders: Secondary | ICD-10-CM

## 2017-06-17 DIAGNOSIS — Z01419 Encounter for gynecological examination (general) (routine) without abnormal findings: Secondary | ICD-10-CM | POA: Insufficient documentation

## 2017-06-17 DIAGNOSIS — I10 Essential (primary) hypertension: Secondary | ICD-10-CM

## 2017-06-17 DIAGNOSIS — Z0000 Encounter for general adult medical examination without abnormal findings: Secondary | ICD-10-CM | POA: Diagnosis not present

## 2017-06-17 LAB — COMPLETE METABOLIC PANEL WITH GFR
ALBUMIN: 4.3 g/dL (ref 3.6–5.1)
ALK PHOS: 61 U/L (ref 33–115)
ALT: 14 U/L (ref 6–29)
AST: 12 U/L (ref 10–30)
BUN: 8 mg/dL (ref 7–25)
CALCIUM: 9.7 mg/dL (ref 8.6–10.2)
CHLORIDE: 104 mmol/L (ref 98–110)
CO2: 21 mmol/L (ref 20–31)
Creat: 0.8 mg/dL (ref 0.50–1.10)
Glucose, Bld: 86 mg/dL (ref 65–99)
POTASSIUM: 4.6 mmol/L (ref 3.5–5.3)
Sodium: 138 mmol/L (ref 135–146)
Total Bilirubin: 0.4 mg/dL (ref 0.2–1.2)
Total Protein: 7.6 g/dL (ref 6.1–8.1)

## 2017-06-17 LAB — HEPATITIS PANEL, ACUTE
HCV Ab: NEGATIVE
HEP A IGM: NONREACTIVE
HEP B S AG: NEGATIVE
Hep B C IgM: NONREACTIVE

## 2017-06-17 LAB — HIV ANTIBODY (ROUTINE TESTING W REFLEX): HIV: NONREACTIVE

## 2017-06-17 LAB — TSH: TSH: 1.27 m[IU]/L

## 2017-06-17 LAB — LIPID PANEL
CHOLESTEROL: 179 mg/dL (ref <200)
HDL: 36 mg/dL — AB (ref >50)
LDL Cholesterol: 126 mg/dL — ABNORMAL HIGH (ref <100)
TRIGLYCERIDES: 83 mg/dL (ref <150)
Total CHOL/HDL Ratio: 5 Ratio — ABNORMAL HIGH (ref <5.0)
VLDL: 17 mg/dL (ref <30)

## 2017-06-17 MED ORDER — BISOPROLOL-HYDROCHLOROTHIAZIDE 5-6.25 MG PO TABS: 1.0000 | ORAL_TABLET | Freq: Every day | ORAL | 3 refills | Status: DC

## 2017-06-17 MED ORDER — PHENTERMINE HCL 37.5 MG PO TABS
37.5000 mg | ORAL_TABLET | Freq: Every day | ORAL | 0 refills | Status: DC
Start: 2017-06-17 — End: 2018-02-04

## 2017-06-17 NOTE — Progress Notes (Signed)
Subjective:     Lauren Melton is a 31 y.o. female and is here for a comprehensive physical exam. The patient reports problems - she would like to lose weight. she is not actively trying to do anything for weigh tloss. . STD ordered Social History   Social History  . Marital status: Single    Spouse name: N/A  . Number of children: N/A  . Years of education: N/A   Occupational History  . Not on file.   Social History Main Topics  . Smoking status: Never Smoker  . Smokeless tobacco: Never Used  . Alcohol use No  . Drug use: No  . Sexual activity: Yes   Other Topics Concern  . Not on file   Social History Narrative  . No narrative on file   Health Maintenance  Topic Date Due  . PAP SMEAR  01/07/2006  . INFLUENZA VACCINE  08/27/2017 (Originally 07/29/2017)  . TETANUS/TDAP  09/28/2025  . HIV Screening  Completed    The following portions of the patient's history were reviewed and updated as appropriate: allergies, current medications, past family history, past medical history, past social history, past surgical history and problem list.  Review of Systems Pertinent items noted in HPI and remainder of comprehensive ROS otherwise negative.   Objective:    BP 131/79   Pulse 74   Ht 5\' 7"  (1.702 m)   Wt 287 lb (130.2 kg)   BMI 44.95 kg/m  General appearance: alert, cooperative, appears stated age and morbidly obese Head: Normocephalic, without obvious abnormality, atraumatic Eyes: conjunctivae/corneas clear. PERRL, EOM's intact. Fundi benign. Ears: normal TM's and external ear canals both ears Nose: Nares normal. Septum midline. Mucosa normal. No drainage or sinus tenderness. Throat: lips, mucosa, and tongue normal; teeth and gums normal Neck: no adenopathy, no carotid bruit, no JVD, supple, symmetrical, trachea midline and thyroid not enlarged, symmetric, no tenderness/mass/nodules Back: symmetric, no curvature. ROM normal. No CVA tenderness. Lungs: clear to  auscultation bilaterally Breasts: normal appearance, no masses or tenderness Heart: regular rate and rhythm, S1, S2 normal, no murmur, click, rub or gallop Abdomen: soft, non-tender; bowel sounds normal; no masses,  no organomegaly Pelvic: cervix normal in appearance, external genitalia normal, no adnexal masses or tenderness, no cervical motion tenderness, uterus normal size, shape, and consistency and vagina normal without discharge Extremities: extremities normal, atraumatic, no cyanosis or edema Pulses: 2+ and symmetric Skin: Skin color, texture, turgor normal. No rashes or lesions Lymph nodes: Cervical, supraclavicular, and axillary nodes normal. Neurologic: Alert and oriented X 3, normal strength and tone. Normal symmetric reflexes. Normal coordination and gait    Assessment:    Healthy female exam.      Plan:  [name] KitchenMarland KitchenSamathia was seen today for annual exam and gynecologic exam.  Diagnoses and all orders for this visit:  Routine physical examination -     Cytology - PAP -     bisoprolol-hydrochlorothiazide (ZIAC) 5-6.25 MG tablet; Take 1 tablet by mouth daily. -     phentermine (ADIPEX-P) 37.5 MG tablet; Take 1 tablet (37.5 mg total) by mouth daily before breakfast. -     HIV antibody (with reflex) -     RPR -     COMPLETE METABOLIC PANEL WITH GFR -     TSH -     Lipid panel -     Hepatitis panel, acute  Encounter for routine gynecological examination with Papanicolaou smear of cervix -     Cytology - PAP  Screening examination  for STD (sexually transmitted disease) -     HIV antibody (with reflex) -     RPR  Screening for diabetes mellitus -     COMPLETE METABOLIC PANEL WITH GFR  Screening for lipid disorders -     Lipid panel  Morbid obesity (HCC) -     phentermine (ADIPEX-P) 37.5 MG tablet; Take 1 tablet (37.5 mg total) by mouth daily before breakfast. -     TSH  Essential hypertension -     bisoprolol-hydrochlorothiazide (ZIAC) 5-6.25 MG tablet; Take 1 tablet by  mouth daily.  .. Depression screen [facility] 2/9 06/17/2017  Decreased Interest 0  Down, Depressed, Hopeless 0  PHQ - 2 Score 0    Discussed weight loss.  1500 calorie diet.  150 minutes of exercise a week.  Phentermine started.  Discussed side effects. Follow up in 1 month.    See After Visit Summary for Counseling Recommendations

## 2017-06-18 LAB — CYTOLOGY - PAP
BACTERIAL VAGINITIS: NEGATIVE
Candida vaginitis: NEGATIVE
Chlamydia: NEGATIVE
Diagnosis: NEGATIVE
HPV (WINDOPATH): NOT DETECTED
NEISSERIA GONORRHEA: NEGATIVE
Trichomonas: NEGATIVE

## 2017-06-18 LAB — RPR

## 2017-07-20 DIAGNOSIS — Z113 Encounter for screening for infections with a predominantly sexual mode of transmission: Secondary | ICD-10-CM | POA: Diagnosis not present

## 2017-07-20 DIAGNOSIS — N760 Acute vaginitis: Secondary | ICD-10-CM | POA: Diagnosis not present

## 2017-07-21 ENCOUNTER — Telehealth: Payer: Self-pay | Admitting: Physician Assistant

## 2017-07-21 ENCOUNTER — Ambulatory Visit: Payer: BLUE CROSS/BLUE SHIELD | Admitting: Family Medicine

## 2017-07-21 NOTE — Telephone Encounter (Signed)
She should contact her pharmacy and see if they have an alternative of the same medicine but different manufacturer that was not affected by the recall - they should contact us if a new prescription I sneeded

## 2017-07-21 NOTE — Telephone Encounter (Signed)
Dr A: Melton called(Lauren Melton). She states there is a recall on her bp medication, Bisoprolol-hctz and needs another med called in to her pharmacy.  Thank you.

## 2017-09-09 ENCOUNTER — Encounter: Payer: Self-pay | Admitting: Physician Assistant

## 2017-09-09 ENCOUNTER — Ambulatory Visit (INDEPENDENT_AMBULATORY_CARE_PROVIDER_SITE_OTHER): Payer: BLUE CROSS/BLUE SHIELD | Admitting: Physician Assistant

## 2017-09-09 VITALS — BP 119/73 | HR 58 | Temp 98.3°F | Wt 285.0 lb

## 2017-09-09 DIAGNOSIS — R300 Dysuria: Secondary | ICD-10-CM | POA: Diagnosis not present

## 2017-09-09 DIAGNOSIS — Z8742 Personal history of other diseases of the female genital tract: Secondary | ICD-10-CM | POA: Diagnosis not present

## 2017-09-09 DIAGNOSIS — R631 Polydipsia: Secondary | ICD-10-CM

## 2017-09-09 DIAGNOSIS — R350 Frequency of micturition: Secondary | ICD-10-CM | POA: Diagnosis not present

## 2017-09-09 LAB — WET PREP FOR TRICH, YEAST, CLUE

## 2017-09-09 LAB — POCT URINALYSIS DIPSTICK
BILIRUBIN UA: NEGATIVE
GLUCOSE UA: NEGATIVE
KETONES UA: NEGATIVE
Leukocytes, UA: NEGATIVE
Nitrite, UA: NEGATIVE
PH UA: 6 (ref 5.0–8.0)
Protein, UA: NEGATIVE
RBC UA: NEGATIVE
Spec Grav, UA: 1.015 (ref 1.010–1.025)
Urobilinogen, UA: 0.2 E.U./dL

## 2017-09-09 MED ORDER — CIPROFLOXACIN HCL 500 MG PO TABS: 500.0000 mg | ORAL_TABLET | Freq: Two times a day (BID) | ORAL | 0 refills | Status: DC

## 2017-09-09 MED ORDER — PHENAZOPYRIDINE HCL 200 MG PO TABS: 200.0000 mg | ORAL_TABLET | Freq: Three times a day (TID) | ORAL | 0 refills | Status: AC

## 2017-09-09 NOTE — Progress Notes (Signed)
Call pt: negative for yeast, clue cells, trichomonas.

## 2017-09-09 NOTE — Progress Notes (Signed)
Subjective:    Patient ID: Lauren Melton, female    DOB: 1986-02-24, 31 y.o.   MRN: [id number]  HPI  Pt is a 31 yo female who presents to the clinic with increased urination, some pain with urination, she also has a hx of BV that is not always symptomatic. No odor or itching today.  Pt feels like she has been more thirsty lately. She has family hx of DM and wants to make sure she does not have DM. She is sexually active with one partner.   .. Active Ambulatory Problems    Diagnosis Date Noted  . Cesarean delivery delivered 01/14/2016  . Essential hypertension 08/13/2016  . History of benign brain tumor 08/13/2016  . 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 No energy 08/27/2016  . Morbid obesity ([place]) 08/27/2016  . Vitamin D insufficiency 08/29/2016   Resolved Ambulatory Problems    Diagnosis Date Noted  . No Resolved Ambulatory Problems   Past Medical History:  Diagnosis Date  . HSV-2 seropositive   . Hypertension           Review of Systems  All other systems reviewed and are negative.      Objective:   Physical Exam  Constitutional: She is oriented to person, place, and time. She appears well-developed and well-nourished.  HENT:  Head: Normocephalic and atraumatic.  Cardiovascular: Normal rate, regular rhythm and normal heart sounds.   Pulmonary/Chest: Effort normal and breath sounds normal. She has no wheezes.  No CVA tenderness.   Abdominal: Soft. Bowel sounds are normal. She exhibits no distension and no mass. There is no tenderness. There is no rebound and no guarding.  Neurological: She is alert and oriented to person, place, and time.  Psychiatric: She has a normal mood and affect. Her behavior is normal.          Assessment & Plan:  [name] KitchenMarland KitchenSamathia was seen today for dysuria and gc/chlamydia check.  Diagnoses and all orders for this visit:  Urinary frequency -     Cancel: WET PREP FOR TRICH, YEAST, CLUE -     Urinalysis Dipstick -     ciprofloxacin (CIPRO)  500 MG tablet; Take 1 tablet (500 mg total) by mouth 2 (two) times daily. For 3 days. -     phenazopyridine (PYRIDIUM) 200 MG tablet; Take 1 tablet (200 mg total) by mouth 3 (three) times daily. -     Urine Culture -     C. trachomatis/N. gonorrhoeae RNA  Dysuria -     Cancel: WET PREP FOR TRICH, YEAST, CLUE -     Urinalysis Dipstick -     ciprofloxacin (CIPRO) 500 MG tablet; Take 1 tablet (500 mg total) by mouth 2 (two) times daily. For 3 days. -     phenazopyridine (PYRIDIUM) 200 MG tablet; Take 1 tablet (200 mg total) by mouth 3 (three) times daily. -     Urine Culture -     C. trachomatis/N. gonorrhoeae RNA  History of vaginitis -     Cancel: WET PREP FOR TRICH, YEAST, CLUE -     Urinalysis Dipstick -     WET PREP FOR TRICH, YEAST, CLUE -     C. trachomatis/N. gonorrhoeae RNA  Increased thirst -     Urinalysis Dipstick  Other orders -     Cancel: GC/Chlamydia Probe Amp   .[name] Kitchen Results for orders placed or performed in visit on 09/09/17  Urine Culture  Result Value Ref Range   MICRO NUMBER: [id number]  SPECIMEN QUALITY: ADEQUATE    Sample Source NOT GIVEN    STATUS: FINAL    Result:      Single organism less than 10,000 CFU/mL isolated. These organisms, commonly found on external and internal genitalia, are considered colonizers. No further testing performed.  C. trachomatis/N. gonorrhoeae RNA  Result Value Ref Range   C. trachomatis RNA, TMA NOT DETECTED NOT DETECT   N. gonorrhoeae RNA, TMA NOT DETECTED NOT DETECT  Urinalysis Dipstick  Result Value Ref Range   Color, UA yellow    Clarity, UA clear    Glucose, UA negative    Bilirubin, UA negtive    Ketones, UA negative    Spec Grav, UA 1.015 1.010 - 1.025   Blood, UA negative    pH, UA 6.0 5.0 - 8.0   Protein, UA negative    Urobilinogen, UA 0.2 0.2 or 1.0 E.U./dL   Nitrite, UA negative    Leukocytes, UA Negative Negative  WET PREP FOR TRICH, YEAST, CLUE  Result Value Ref Range   Source: NOT GIVEN    RESULT      UA dipstick normal. No glucose in urine. Last random glucose was 89. No DM.  Due to [place] in [place]. Gave cipro to keep on hand if symptoms worsen or if culture comes back positive.  STD testing ordered.  Wet prep STAT was negative.  Use pyridium for bladder inflammation.

## 2017-09-10 LAB — C. TRACHOMATIS/N. GONORRHOEAE RNA
C. trachomatis RNA, TMA: NOT DETECTED
N. gonorrhoeae RNA, TMA: NOT DETECTED

## 2017-09-11 LAB — URINE CULTURE
MICRO NUMBER:: 81007091
SPECIMEN QUALITY:: ADEQUATE

## 2017-10-22 DIAGNOSIS — Z3200 Encounter for pregnancy test, result unknown: Secondary | ICD-10-CM | POA: Diagnosis not present

## 2017-10-22 DIAGNOSIS — Z3009 Encounter for other general counseling and advice on contraception: Secondary | ICD-10-CM | POA: Diagnosis not present

## 2017-12-03 DIAGNOSIS — Z3009 Encounter for other general counseling and advice on contraception: Secondary | ICD-10-CM | POA: Diagnosis not present

## 2017-12-03 DIAGNOSIS — Z113 Encounter for screening for infections with a predominantly sexual mode of transmission: Secondary | ICD-10-CM | POA: Diagnosis not present

## 2018-02-04 ENCOUNTER — Encounter: Payer: Self-pay | Admitting: Physician Assistant

## 2018-02-04 ENCOUNTER — Ambulatory Visit: Payer: BLUE CROSS/BLUE SHIELD | Admitting: Physician Assistant

## 2018-02-04 VITALS — BP 138/91 | HR 72 | Temp 98.3°F | Wt 292.0 lb

## 2018-02-04 DIAGNOSIS — J020 Streptococcal pharyngitis: Secondary | ICD-10-CM | POA: Diagnosis not present

## 2018-02-04 DIAGNOSIS — J3489 Other specified disorders of nose and nasal sinuses: Secondary | ICD-10-CM | POA: Diagnosis not present

## 2018-02-04 LAB — POCT RAPID STREP A (OFFICE): Rapid Strep A Screen: POSITIVE — AB

## 2018-02-04 MED ORDER — IPRATROPIUM BROMIDE 0.06 % NA SOLN: 2.0000 | Freq: Four times a day (QID) | NASAL | 0 refills | Status: DC | PRN

## 2018-02-04 MED ORDER — PREDNISONE 20 MG PO TABS: 40.0000 mg | ORAL_TABLET | Freq: Every day | ORAL | 0 refills | Status: AC

## 2018-02-04 MED ORDER — AMOXICILLIN 875 MG PO TABS: 875.0000 mg | ORAL_TABLET | Freq: Two times a day (BID) | ORAL | 0 refills | Status: AC

## 2018-02-04 NOTE — Patient Instructions (Addendum)
For sore throat - prednisone with breakfast daily for 5 days - antibiotic twice a day for 1 week - warm, salt water gargles - Cepacol throat spray and lozenges - tylenol 1000 mg every 8 hours as needed for pain - stop all other over-the-counter medications   Strep Throat Strep throat is a bacterial infection of the throat. Your health care provider may call the infection tonsillitis or pharyngitis, depending on whether there is swelling in the tonsils or at the back of the throat. Strep throat is most common during the cold months of the year in children who are 50-19 years of age, but it can happen during any season in people of any age. This infection is spread from person to person (contagious) through coughing, sneezing, or close contact. What are the causes? Strep throat is caused by the bacteria called Streptococcus pyogenes. What increases the risk? This condition is more likely to develop in:  People who spend time in crowded places where the infection can spread easily.  People who have close contact with someone who has strep throat.  What are the signs or symptoms? Symptoms of this condition include:  Fever or chills.  Redness, swelling, or pain in the tonsils or throat.  Pain or difficulty when swallowing.  White or yellow spots on the tonsils or throat.  Swollen, tender glands in the neck or under the jaw.  Red rash all over the body (rare).  How is this diagnosed? This condition is diagnosed by performing a rapid strep test or by taking a swab of your throat (throat culture test). Results from a rapid strep test are usually ready in a few minutes, but throat culture test results are available after one or two days. How is this treated? This condition is treated with antibiotic medicine. Follow these instructions at home: Medicines  Take over-the-counter and prescription medicines only as told by your health care provider.  Take your antibiotic as told by your  health care provider. Do not stop taking the antibiotic even if you start to feel better.  Have family members who also have a sore throat or fever tested for strep throat. They may need antibiotics if they have the strep infection. Eating and drinking  Do not share food, drinking cups, or personal items that could cause the infection to spread to other people.  If swallowing is difficult, try eating soft foods until your sore throat feels better.  Drink enough fluid to keep your urine clear or pale yellow. General instructions  Gargle with a salt-water mixture 3-4 times per day or as needed. To make a salt-water mixture, completely dissolve -1 tsp of salt in 1 cup of warm water.  Make sure that all household members wash their hands well.  Get plenty of rest.  Stay home from school or work until you have been taking antibiotics for 24 hours.  Keep all follow-up visits as told by your health care provider. This is important. Contact a health care provider if:  The glands in your neck continue to get bigger.  You develop a rash, cough, or earache.  You cough up a thick liquid that is green, yellow-brown, or bloody.  You have pain or discomfort that does not get better with medicine.  Your problems seem to be getting worse rather than better.  You have a fever. Get help right away if:  You have new symptoms, such as vomiting, severe headache, stiff or painful neck, chest pain, or shortness of breath.  You have severe throat pain, drooling, or changes in your voice.  You have swelling of the neck, or the skin on the neck becomes red and tender.  You have signs of dehydration, such as fatigue, dry mouth, and decreased urination.  You become increasingly sleepy, or you cannot wake up completely.  Your joints become red or painful. This information is not intended to replace advice given to you by your health care provider. Make sure you discuss any questions you have with  your health care provider. Document Released: 12/12/2000 Document Revised: 08/13/2016 Document Reviewed: 04/09/2015 Elsevier Interactive Patient Education  [employer].

## 2018-02-04 NOTE — Progress Notes (Signed)
HPI:                                                                Lauren Melton is a 32 y.o. female who presents to [facility] today for sore throat  Sore Throat   This is a new problem. The current episode started in the past 7 days. The problem has been unchanged. Neither side of throat is experiencing more pain than the other. There has been no fever. The pain is severe. Associated symptoms include congestion, coughing (+ PND, dry cough), ear pain and swollen glands. She has had exposure to strep. She has tried acetaminophen (multiple OTC cold meds) for the symptoms. The treatment provided no relief.  She had a negative rapid Strep test on 02/01/18    Depression screen PHQ 2/9 06/17/2017  Decreased Interest 0  Down, Depressed, Hopeless 0  PHQ - 2 Score 0    No flowsheet data found.    Past Medical History:  Diagnosis Date  . 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 Hypertension    Past Surgical History:  Procedure Laterality Date  . APPENDECTOMY    . BRAIN TUMOR EXCISION  2002  . CESAREAN SECTION N/A 01/13/2016   Procedure: CESAREAN SECTION;  Surgeon: Louretta Shorten, MD;  Location: [facility];  Service: Obstetrics;  Laterality: N/A;   Social History   Tobacco Use  . Smoking status: Never Smoker  . Smokeless tobacco: Never Used  Substance Use Topics  . Alcohol use: No   family history includes Arthritis in her father; Cancer in her maternal aunt, maternal grandfather, and paternal grandfather; Diabetes in her paternal aunt and paternal aunt; Hypertension in her father, paternal aunt, paternal aunt, and paternal grandmother.    ROS: negative except as noted in the HPI  Medications: Current Outpatient Medications  Medication Sig Dispense Refill  . bisoprolol-hydrochlorothiazide (ZIAC) 5-6.25 MG tablet Take 1 tablet by mouth daily. 90 tablet 3   No current facility-administered medications for this visit.    No  Known Allergies     Objective:  BP (!) 138/91   Pulse 72   Temp 98.3 F (36.8 C) (Oral)   Wt 292 lb (132.5 kg)   BMI 45.73 kg/m  Gen:  alert, not ill-appearing, no distress, appropriate for age 73: head normocephalic without obvious abnormality, conjunctiva and cornea clear, TM's clear bilaterally, nasal mucosa edematous with rhinorrhea, oropharynx with erythema and edema, there is an exudate or possible tonsil stone on the right tonsil, tonsils grade 2-3, tender tonsillar adenopathy, neck supple, trachea midline Pulm: Normal work of breathing, normal phonation, clear to auscultation bilaterally, no wheezes, rales or rhonchi CV: Normal rate, regular rhythm, s1 and s2 distinct, no murmurs, clicks or rubs  Neuro: alert and oriented x 3, no tremor MSK: extremities atraumatic, normal gait and station Skin: intact, no rashes on exposed skin, no jaundice, no cyanosis     No results found for this or any previous visit (from the past 72 hour(s)). No results found.    Assessment and Plan: 32 y.o. female with   1. Strep pharyngitis - POCT rapid strep A positive today, she had a false negative rapid Strep 3 days ago - predniSONE (DELTASONE) 20 MG  tablet; Take 2 tablets (40 mg total) by mouth daily with breakfast for 5 days.  Dispense: 10 tablet; Refill: 0 - amoxicillin (AMOXIL) 875 MG tablet; Take 1 tablet (875 mg total) by mouth 2 (two) times daily for 7 days.  Dispense: 14 tablet; Refill: 0  2. Rhinorrhea - ipratropium (ATROVENT) 0.06 % nasal spray; Place 2 sprays into both nostrils 4 (four) times daily as needed.  Dispense: 15 mL; Refill: 0   Patient education and anticipatory guidance given Patient agrees with treatment plan Follow-up as needed if symptoms worsen or fail to improve  Darlyne Russian PA-C

## 2018-02-05 ENCOUNTER — Ambulatory Visit: Payer: BLUE CROSS/BLUE SHIELD | Admitting: Physician Assistant

## 2018-04-02 ENCOUNTER — Other Ambulatory Visit: Payer: Self-pay | Admitting: Family Medicine

## 2018-05-19 ENCOUNTER — Ambulatory Visit (INDEPENDENT_AMBULATORY_CARE_PROVIDER_SITE_OTHER): Payer: BLUE CROSS/BLUE SHIELD | Admitting: Physician Assistant

## 2018-05-19 ENCOUNTER — Encounter: Payer: Self-pay | Admitting: Physician Assistant

## 2018-05-19 ENCOUNTER — Other Ambulatory Visit (HOSPITAL_COMMUNITY)
Admission: RE | Admit: 2018-05-19 | Discharge: 2018-05-19 | Disposition: A | Discharge status: Home / Self Care | Payer: BLUE CROSS/BLUE SHIELD | Source: Ambulatory Visit | Attending: Physician Assistant | Admitting: Physician Assistant

## 2018-05-19 VITALS — BP 134/81 | HR 80 | Temp 98.8°F | Wt 289.0 lb

## 2018-05-19 DIAGNOSIS — Z01419 Encounter for gynecological examination (general) (routine) without abnormal findings: Secondary | ICD-10-CM | POA: Insufficient documentation

## 2018-05-19 DIAGNOSIS — K5901 Slow transit constipation: Secondary | ICD-10-CM | POA: Diagnosis not present

## 2018-05-19 DIAGNOSIS — I10 Essential (primary) hypertension: Secondary | ICD-10-CM

## 2018-05-19 DIAGNOSIS — Z131 Encounter for screening for diabetes mellitus: Secondary | ICD-10-CM | POA: Diagnosis not present

## 2018-05-19 DIAGNOSIS — Z0000 Encounter for general adult medical examination without abnormal findings: Secondary | ICD-10-CM | POA: Diagnosis not present

## 2018-05-19 DIAGNOSIS — Z13220 Encounter for screening for lipoid disorders: Secondary | ICD-10-CM

## 2018-05-19 DIAGNOSIS — Z113 Encounter for screening for infections with a predominantly sexual mode of transmission: Secondary | ICD-10-CM | POA: Diagnosis not present

## 2018-05-19 DIAGNOSIS — Z1321 Encounter for screening for nutritional disorder: Secondary | ICD-10-CM | POA: Diagnosis not present

## 2018-05-19 NOTE — Patient Instructions (Addendum)
Hidradenitis Suppurativa Hidradenitis suppurativa is a long-term (chronic) skin disease that starts with blocked sweat glands or hair follicles. Bacteria may grow in these blocked openings of your skin. Hidradenitis suppurativa is like a severe form of acne that develops in areas of your body where acne would be unusual. It is most likely to affect the areas of your body where skin rubs against skin and becomes moist. This includes your:  Underarms.  Groin.  Genital areas.  Buttocks.  Upper thighs.  Breasts.  Hidradenitis suppurativa may start out with small pimples. The pimples can develop into deep sores that break open (rupture) and drain pus. Over time your skin may thicken and become scarred. Hidradenitis suppurativa cannot be passed from person to person. What are the causes? The exact cause of hidradenitis suppurativa is not known. This condition may be due to:  Female and female hormones. The condition is rare before and after puberty.  An overactive body defense system (immune system). Your immune system may overreact to the blocked hair follicles or sweat glands and cause swelling and pus-filled sores.  What increases the risk? You may have a higher risk of hidradenitis suppurativa if you:  Are a woman.  Are between ages 11 and 55.  Have a family history of hidradenitis suppurativa.  Have a personal history of acne.  Are overweight.  Smoke.  Take the drug lithium.  What are the signs or symptoms? The first signs of an outbreak are usually painful skin bumps that look like pimples. As the condition progresses:  Skin bumps may get bigger and grow deeper into the skin.  Bumps under the skin may rupture and drain smelly pus.  Skin may become itchy and infected.  Skin may thicken and scar.  Drainage may continue through tunnels under the skin (fistulas).  Walking and moving your arms can become painful.  How is this diagnosed? Your health care provider may  diagnose hidradenitis suppurativa based on your medical history and your signs and symptoms. A physical exam will also be done. You may need to see a health care provider who specializes in skin diseases (dermatologist). You may also have tests done to confirm the diagnosis. These can include:  Swabbing a sample of pus or drainage from your skin so it can be sent to the lab and tested for infection.  Blood tests to check for infection.  How is this treated? The same treatment will not work for everybody with hidradenitis suppurativa. Your treatment will depend on how severe your symptoms are. You may need to try several treatments to find what works best for you. Part of your treatment may include cleaning and bandaging (dressing) your wounds. You may also have to take medicines, such as the following:  Antibiotics.  Acne medicines.  Medicines to block or suppress the immune system.  A diabetes medicine (metformin) is sometimes used to treat this condition.  For women, birth control pills can sometimes help relieve symptoms.  You may need surgery if you have a severe case of hidradenitis suppurativa that does not respond to medicine. Surgery may involve:  Using a laser to clear the skin and remove hair follicles.  Opening and draining deep sores.  Removing the areas of skin that are diseased and scarred.  Follow these instructions at home:  Learn as much as you can about your disease, and work closely with your health care providers.  Take medicines only as directed by your health care provider.  If you were prescribed   an antibiotic medicine, finish it all even if you start to feel better.  If you are overweight, losing weight may be very helpful. Try to reach and maintain a healthy weight.  Do not use any tobacco products, including cigarettes, chewing tobacco, or electronic cigarettes. If you need help quitting, ask your health care provider.  Do not shave the areas where you  get hidradenitis suppurativa.  Do not wear deodorant.  Wear loose-fitting clothes.  Try not to overheat and get sweaty.  Take a daily bleach bath as directed by your health care provider. ? Fill your bathtub halfway with water. ? Pour in  cup of unscented household bleach. ? Soak for 5-10 minutes.  Cover sore areas with a warm, clean washcloth (compress) for 5-10 minutes. Contact a health care provider if:  You have a flare-up of hidradenitis suppurativa.  You have chills or a fever.  You are having trouble controlling your symptoms at home. This information is not intended to replace advice given to you by your health care provider. Make sure you discuss any questions you have with your health care provider. Document Released: 07/29/2004 Document Revised: 05/22/2016 Document Reviewed: 03/17/2014 Elsevier Interactive Patient Education  2018 [place]; Topiramate extended-release capsules What is this medicine? Phentermine; topiramate (FEN ter meen; toe PYRE a mate) is a combination of two medicines used with a reduced calorie diet and exercise to help you lose weight. This medicine is only available through certified pharmacies enrolled in a special program. Your healthcare professional will tell you where you can get your medicine. If you have additional questions, you can visit the manufacture's website at [email or web address] or contact them by phone at [phone]. This medicine may be used for other purposes; ask your health care provider or pharmacist if you have questions. COMMON BRAND NAME(S): Qsymia What should I tell my health care provider before I take this medicine? They need to know if you have any of these conditions: -agitation -diarrhea -depression or other mental illness -diabetes -glaucoma -heart disease -high or low blood pressure -history of anorexia or other eating disorder -history of substance abuse -kidney stones or kidney  disease -liver disease -lung disease like asthma, obstructive pulmonary disease, emphysema -metabolic acidosis -on a ketogenic diet -scheduled for surgery or a procedure -suicidal thoughts, plans, or attempt; a previous suicide attempt by you or a family member -taken an MAOI like Carbex, Eldepryl, Marplan, Nardil, or Parnate in last 14 days -thyroid disease -an unusual or allergic reaction to phentermine, topiramate, other medicines, foods, dyes, or preservatives -pregnant or trying to get pregnant -breast-feeding How should I use this medicine? Take this medicine by mouth with a glass of water. Follow the directions on the prescription label. Do not crush or chew. This medicine is usually taken with or without food once per day in the morning. Avoid taking this medicine in the evening. It may interfere with sleep. Take your doses at regular intervals. Do not take your medicine more often than directed. A special MedGuide will be given to you by the pharmacist with each prescription and refill. Be sure to read this information carefully each time. Talk to your pediatrician regarding the use of this medicine in children. Special care may be needed. Overdosage: If you think you have taken too much of this medicine contact a poison control center or emergency room at once. NOTE: This medicine is only for you. Do not share this medicine with others. What if I miss a  dose? If you miss a dose, take it as soon as you can. If it is almost time for your next dose, take only that dose. Do not take double or extra doses. What may interact with this medicine? Do not take this medicine with any of the following medications: -MAOIs like Carbex, Eldepryl, Marplan, Nardil, and Parnate This medicine may also interact with the following medications: -acetazolamide -amitriptyline -antihistamines for allergy, cough and cold -atropine -birth control pills -carbamazepine -certain medicines for bladder  problems like oxybutynin, tolterodine -certain medicines for depression, anxiety, or psychotic disturbances -certain medicines for Parkinson's disease like benztropine, trihexyphenidyl -certain medicines for stomach problems like dicyclomine, hyoscyamine -certain medicines for travel sickness like scopolamine -dichlorphenamide -digoxin -diltiazem -diuretics -hydrochlorothiazide -ipratropium -lithium -medicines for diabetes -medicines for pain, sleep, or muscle relaxation -methazolamide -phenytoin -pioglitazone -stimulant medicines for attention disorders, weight loss, or to stay awake -valproic acid -zonisamide This list may not describe all possible interactions. Give your health care provider a list of all the medicines, herbs, non-prescription drugs, or dietary supplements you use. Also tell them if you smoke, drink alcohol, or use illegal drugs. Some items may interact with your medicine. What should I watch for while using this medicine? Visit your doctor or health care professional for regular checks on your progress. This medicine is intended to be used in addition to a healthy diet and appropriate exercise. The best results are achieved this way. Do not increase or in any way change your dose without consulting your doctor or health care professional. Do not take this medicine within 6 hours of bedtime. It can keep you from getting to sleep. Avoid drinks that contain caffeine and try to stick to a regular bedtime every night. Do not stop taking this medicine suddenly. This increases the risk of seizures. This medicine can decrease sweating and increase your body temperature. Watch for signs of deceased sweating or fever. Avoid extreme heat, hot baths, and saunas. Be careful about exercising, especially in hot weather. Contact your health care provider right away if you notice a fever or decrease in sweating. You should drink plenty of fluids while taking this medicine. If you have  had kidney stones in the past, this will help to reduce your chances of forming kidney stones. If you have stomach pain, with nausea or vomiting and yellowing of your eyes or skin, call your doctor immediately. You may get drowsy or dizzy. Do not drive, use machinery, or do anything that needs mental alertness until you know how this medicine affects you. Do not stand or sit up quickly, especially if you are an older patient. This reduces the risk of dizzy or fainting spells. Alcohol may increase dizziness and drowsiness. Avoid alcoholic drinks. This medicine may affect blood sugar levels. If you have diabetes, check with your doctor or health care professional before you change your diet or the dose of your diabetic medicine. Patients and their families should watch out for worsening depression or thoughts of suicide. Also watch out for sudden changes in feelings such as feeling anxious, agitated, panicky, irritable, hostile, aggressive, impulsive, severely restless, overly excited and hyperactive, or not being able to sleep. If this happens, especially at the beginning of treatment or after a change in dose, call your health care professional. If you notice blurred vision, eye pain, or other eye problems, seek medical attention at once for an eye exam. This medicine may increase the chance of developing metabolic acidosis. If left untreated, this can cause kidney stones,  bone disease, or slowed growth in children. Symptoms include breathing fast, fatigue, loss of appetite, irregular heartbeat, or loss of consciousness. Call your doctor immediately if you experience any of these side effects. Also, tell your doctor about any surgery you plan on having while taking this medicine since this may increase your risk for metabolic acidosis. Women who become pregnant while using this medicine should contact their physician immediately. You should also contact The [facility] Pregnancy Surveillance Program which is a  program that monitors pregnancies that occur during treatment. Contact the program by calling [phone]. What side effects may I notice from receiving this medicine? Side effects that you should report to your doctor or health care professional as soon as possible: -allergic reactions like skin rash, itching or hives, swelling of the face, lips, or tongue -blood in the urine -changes in vision -chest pain or chest tightness -confusion -depressed mood -difficulty breathing -dizziness -fast or irregular heartbeat -feeling anxious -irritable -loss of appetite -low blood pressure -pain in the lower back or side -pain, tingling, numbness in the hands or feet -pain when urinating -palpitations -redness, blistering, peeling or loosening of the skin, including inside the mouth -shortness of breath -suicidal thoughts or other mood changes -trouble passing urine or change in the amount of urine -trouble walking, dizziness, loss of balance or coordination -unusually weak or tired -vomiting Side effects that usually do not require medical attention (report to your doctor or health care professional if they continue or are bothersome): -change in sex drive or performance -changes in vision -constipation -diarrhea -dry mouth -headache -nausea -tremors -trouble sleeping -upset stomach This list may not describe all possible side effects. Call your doctor for medical advice about side effects. You may report side effects to FDA at 1-800-FDA-1088. Where should I keep my medicine? Keep out of the reach of children. This medicine can be abused. Keep your medicine in a safe place to protect it from theft. Do not share this medicine with anyone. Selling or giving away this medicine is dangerous and against the law. This medicine may cause accidental overdose and death if taken by other adults, children, or pets. Mix any unused medicine with a substance like cat litter or coffee grounds. Then  throw the medicine away in a sealed container like a sealed bag or a coffee can with a lid. Do not use the medicine after the expiration date. Store at room temperature between 15 and 25 degrees C (59 and 77 degrees F). NOTE: This sheet is a summary. It may not cover all possible information. If you have questions about this medicine, talk to your doctor, pharmacist, or health care provider.  2018 Elsevier/Gold Standard (2016-01-17 14:30:46) Lorcaserin oral tablets What is this medicine? LORCASERIN (lor ca SER in) is used to promote and maintain weight loss in obese patients. This medicine should be used with a reduced calorie diet and, if appropriate, an exercise program. This medicine may be used for other purposes; ask your health care provider or pharmacist if you have questions. COMMON BRAND NAME(S): Belviq What should I tell my health care provider before I take this medicine? They need to know if you have any of these conditions: -anatomical deformation of the penis, Peyronie's disease, or history of priapism (painful and prolonged erection) -diabetes -heart disease -history of blood diseases, like sickle cell anemia or leukemia -history of irregular heartbeat -kidney disease -liver disease -suicidal thoughts, plans, or attempt; a previous suicide attempt by you or a family member -an unusual  or allergic reaction to lorcaserin, other medicines, foods, dyes, or preservatives -pregnant or trying to get pregnant -breast-feeding How should I use this medicine? Take this medicine by mouth with a glass of water. Follow the directions on the prescription label. You can take it with or without food. Take your medicine at regular intervals. Do not take it more often than directed. Do not stop taking except on your doctor's advice. Talk to your pediatrician regarding the use of this medicine in children. Special care may be needed. Overdosage: If you think you have taken too much of this  medicine contact a poison control center or emergency room at once. NOTE: This medicine is only for you. Do not share this medicine with others. What if I miss a dose? If you miss a dose, take it as soon as you can. If it is almost time for your next dose, take only that dose. Do not take double or extra doses. What may interact with this medicine? -cabergoline -certain medicines for depression, anxiety, or psychotic disturbances -certain medicines for erectile dysfunction -certain medicines for migraine headache like almotriptan, eletriptan, frovatriptan, naratriptan, rizatriptan, sumatriptan, zolmitriptan -dextromethorphan -linezolid -lithium -medicines for diabetes -other weight loss products -tramadol -St. John's Wort -stimulant medicines for attention disorders, weight loss, or to stay awake -tryptophan This list may not describe all possible interactions. Give your health care provider a list of all the medicines, herbs, non-prescription drugs, or dietary supplements you use. Also tell them if you smoke, drink alcohol, or use illegal drugs. Some items may interact with your medicine. What should I watch for while using this medicine? This medicine is intended to be used in addition to a healthy diet and appropriate exercise. The best results are achieved this way. Your doctor should instruct you to stop taking this medicine if you do not lose a certain amount of weight within the first 12 weeks of treatment, but it is important that you do not change your dose in any way without consulting your doctor or health care professional. Visit your doctor or health care professional for regular checkups. Your doctor may order blood tests or other tests to see how you are doing. Do not drive, use machinery, or do anything that needs mental alertness until you know how this medicine affects you. This medicine may affect blood sugar levels. If you have diabetes, check with your doctor or health care  professional before you change your diet or the dose of your diabetic medicine. Patients and their families should watch out for worsening depression or thoughts of suicide. Also watch out for sudden changes in feelings such as feeling anxious, agitated, panicky, irritable, hostile, aggressive, impulsive, severely restless, overly excited and hyperactive, or not being able to sleep. If this happens, especially at the beginning of treatment or after a change in dose, call your health care professional. Contact your doctor or health care professional right away if you are a man with an erection that lasts longer than 4 hours or if the erection becomes painful. This may be a sign of serious problem and must be treated right away to prevent permanent damage. What side effects may I notice from receiving this medicine? Side effects that you should report to your doctor or health care professional as soon as possible: -allergic reactions like skin rash, itching or hives, swelling of the face, lips, or tongue -abnormal production of milk -breast enlargement in both males and females -breathing problems -changes in emotions or moods -changes in  vision -confusion -erection lasting more than 4 hours or a painful erection -fast or irregular heart beat -feeling faint or lightheaded, falls -fever or chills, sore throat -hallucination, loss of contact with reality -high or low blood pressure -menstrual changes -restlessness -slow or irregular heartbeat -stiff muscles -sweating -suicidal thoughts or other mood changes -swelling of the ankles, feet, hands -unusually weak or tired -vomiting Side effects that usually do not require medical attention (report to your doctor or health care professional if they continue or are bothersome): -back pain -constipation -cough -dry mouth -nausea -tiredness This list may not describe all possible side effects. Call your doctor for medical advice about side  effects. You may report side effects to FDA at 1-800-FDA-1088. Where should I keep my medicine? Keep out of the reach of children. This medicine can be abused. Keep your medicine in a safe place to protect it from theft. Do not share this medicine with anyone. Selling or giving away this medicine is dangerous and against the law. Store at room temperature between 15 and 30 degrees C (59 and 86 degrees F). Throw away any unused medicine after the expiration date. NOTE: This sheet is a summary. It may not cover all possible information. If you have questions about this medicine, talk to your doctor, pharmacist, or health care provider.  2018 Elsevier/Gold Standard (2016-01-17 12:13:31) Bupropion; Naltrexone extended-release tablets What is this medicine? BUPROPION; NALTREXONE (byoo PROE pee on; nal TREX one) is a combination product used to promote and maintain weight loss in obese adults or overweight adults who also have weight related medical problems. This medicine should be used with a reduced calorie diet and increased physical activity. This medicine may be used for other purposes; ask your health care provider or pharmacist if you have questions. COMMON BRAND NAME(S): CONTRAVE What should I tell my health care provider before I take this medicine? They need to know if you have any of these conditions: -an eating disorder, such as anorexia or bulimia -bipolar disorder -diabetes -depression -drug abuse or addiction -glaucoma -head injury -heart disease -high blood pressure -history of a tumor or infection of your brain or spine -history of stroke -history of irregular heartbeat -if you often drink alcohol -kidney disease -liver disease -schizophrenia -seizures -suicidal thoughts, plans, or attempt; a previous suicide attempt by you or a family member -an unusual or allergic reaction to bupropion, naltrexone, other medicines, foods, dyes, or  preservatives -breast-feeding -pregnant or trying to become pregnant How should I use this medicine? Take this medicine by mouth with a glass of water. Follow the directions on the prescription label. Take this medicine in the morning and in the evenings as directed by your healthcare professional. Dennis Bast can take it with or without food. Do not take with high-fat meals as this may increase your risk of seizures. Do not crush, chew, or cut these tablets. Do not take your medicine more often than directed. Do not stop taking this medicine suddenly except upon the advice of your doctor. A special MedGuide will be given to you by the pharmacist with each prescription and refill. Be sure to read this information carefully each time. Talk to your pediatrician regarding the use of this medicine in children. Special care may be needed. Overdosage: If you think you have taken too much of this medicine contact a poison control center or emergency room at once. NOTE: This medicine is only for you. Do not share this medicine with others. What if I miss a dose?  If you miss a dose, skip the missed dose and take your next tablet at the regular time. Do not take double or extra doses. What may interact with this medicine? Do not take this medicine with any of the following medications: -any prescription or street opioid drug like codeine, heroin, methadone -linezolid -MAOIs like Carbex, Eldepryl, Marplan, Nardil, and Parnate -methylene blue (injected into a vein) -other medicines that contain bupropion like Zyban or Wellbutrin This medicine may also interact with the following medications: -alcohol -certain medicines for anxiety or sleep -certain medicines for blood pressure like metoprolol, propranolol -certain medicines for depression or psychotic disturbances -certain medicines for HIV or AIDS like efavirenz, lopinavir, nelfinavir, ritonavir -certain medicines for irregular heart beat like propafenone,  flecainide -certain medicines for Parkinson's disease like amantadine, levodopa -certain medicines for seizures like carbamazepine, phenytoin, phenobarbital -cimetidine -clopidogrel -cyclophosphamide -digoxin -disulfiram -furazolidone -isoniazid -nicotine -orphenadrine -procarbazine -steroid medicines like prednisone or cortisone -stimulant medicines for attention disorders, weight loss, or to stay awake -tamoxifen -theophylline -thioridazine -thiotepa -ticlopidine -tramadol -warfarin This list may not describe all possible interactions. Give your health care provider a list of all the medicines, herbs, non-prescription drugs, or dietary supplements you use. Also tell them if you smoke, drink alcohol, or use illegal drugs. Some items may interact with your medicine. What should I watch for while using this medicine? This medicine is intended to be used in addition to a healthy diet and appropriate exercise. The best results are achieved this way. Do not increase or in any way change your dose without consulting your doctor or health care professional. Do not take this medicine with other prescription or over-the-counter weight loss products without consulting your doctor or health care professional. Your doctor should tell you to stop taking this medicine if you do not lose a certain amount of weight within the first 12 weeks of treatment. Visit your doctor or health care professional for regular checkups. Your doctor may order blood tests or other tests to see how you are doing. This medicine may affect blood sugar levels. If you have diabetes, check with your doctor or health care professional before you change your diet or the dose of your diabetic medicine. Patients and their families should watch out for new or worsening depression or thoughts of suicide. Also watch out for sudden changes in feelings such as feeling anxious, agitated, panicky, irritable, hostile, aggressive,  impulsive, severely restless, overly excited and hyperactive, or not being able to sleep. If this happens, especially at the beginning of treatment or after a change in dose, call your health care professional. Avoid alcoholic drinks while taking this medicine. Drinking large amounts of alcoholic beverages, using sleeping or anxiety medicines, or quickly stopping the use of these agents while taking this medicine may increase your risk for a seizure. What side effects may I notice from receiving this medicine? Side effects that you should report to your doctor or health care professional as soon as possible: -allergic reactions like skin rash, itching or hives, swelling of the face, lips, or tongue -breathing problems -changes in vision -confusion -elevated mood, decreased need for sleep, racing thoughts, impulsive behavior -fast or irregular heartbeat -hallucinations, loss of contact with reality -increased blood pressure -redness, blistering, peeling or loosening of the skin, including inside the mouth -seizures -signs and symptoms of liver injury like dark yellow or brown urine; general ill feeling or flu-like symptoms; light-colored stools; loss of appetite; nausea; right upper belly pain; unusually weak or tired; yellowing of  the eyes or skin -suicidal thoughts or other mood changes -vomiting Side effects that usually do not require medical attention (report to your doctor or health care professional if they continue or are bothersome): -constipation -headache -loss of appetite -indigestion, stomach upset -tremors This list may not describe all possible side effects. Call your doctor for medical advice about side effects. You may report side effects to FDA at 1-800-FDA-1088. Where should I keep my medicine? Keep out of the reach of children. Store at room temperature between 15 and 30 degrees C (59 and 86 degrees F). Throw away any unused medicine after the expiration date. NOTE: This  sheet is a summary. It may not cover all possible information. If you have questions about this medicine, talk to your doctor, pharmacist, or health care provider.  2018 Elsevier/Gold Standard (2016-06-06 13:42:58) Liraglutide injection (Weight Management) What is this medicine? LIRAGLUTIDE (LIR a GLOO tide) is used with a reduced calorie diet and exercise to help you lose weight. This medicine may be used for other purposes; ask your health care provider or pharmacist if you have questions. COMMON BRAND NAME(S): Saxenda What should I tell my health care provider before I take this medicine? They need to know if you have any of these conditions: -endocrine tumors (MEN 2) or if someone in your family had these tumors -gallbladder disease -high cholesterol -history of alcohol abuse problem -history of pancreatitis -kidney disease or if you are on dialysis -liver disease -previous swelling of the tongue, face, or lips with difficulty breathing, difficulty swallowing, hoarseness, or tightening of the throat -stomach problems -suicidal thoughts, plans, or attempt; a previous suicide attempt by you or a family member -thyroid cancer or if someone in your family had thyroid cancer -an unusual or allergic reaction to liraglutide, other medicines, foods, dyes, or preservatives -pregnant or trying to get pregnant -breast-feeding How should I use this medicine? This medicine is for injection under the skin of your upper leg, stomach area, or upper arm. You will be taught how to prepare and give this medicine. Use exactly as directed. Take your medicine at regular intervals. Do not take it more often than directed. It is important that you put your used needles and syringes in a special sharps container. Do not put them in a trash can. If you do not have a sharps container, call your pharmacist or healthcare provider to get one. A special MedGuide will be given to you by the pharmacist with each  prescription and refill. Be sure to read this information carefully each time. Talk to your pediatrician regarding the use of this medicine in children. Special care may be needed. Overdosage: If you think you have taken too much of this medicine contact a poison control center or emergency room at once. NOTE: This medicine is only for you. Do not share this medicine with others. What if I miss a dose? If you miss a dose, take it as soon as you can. If it is almost time for your next dose, take only that dose. Do not take double or extra doses. If you miss your dose for 3 days or more, call your doctor or health care professional to talk about how to restart this medicine. What may interact with this medicine? -insulin and other medicines for diabetes This list may not describe all possible interactions. Give your health care provider a list of all the medicines, herbs, non-prescription drugs, or dietary supplements you use. Also tell them if you smoke, drink  alcohol, or use illegal drugs. Some items may interact with your medicine. What should I watch for while using this medicine? Visit your doctor or health care professional for regular checks on your progress. This medicine is intended to be used in addition to a healthy diet and appropriate exercise. The best results are achieved this way. Do not increase or in any way change your dose without consulting your doctor or health care professional. Drink plenty of fluids while taking this medicine. Check with your doctor or health care professional if you get an attack of severe diarrhea, nausea, and vomiting. The loss of too much body fluid can make it dangerous for you to take this medicine. This medicine may affect blood sugar levels. If you have diabetes, check with your doctor or health care professional before you change your diet or the dose of your diabetic medicine. Patients and their families should watch out for worsening depression or  thoughts of suicide. Also watch out for sudden changes in feelings such as feeling anxious, agitated, panicky, irritable, hostile, aggressive, impulsive, severely restless, overly excited and hyperactive, or not being able to sleep. If this happens, especially at the beginning of treatment or after a change in dose, call your health care professional. What side effects may I notice from receiving this medicine? Side effects that you should report to your doctor or health care professional as soon as possible: -allergic reactions like skin rash, itching or hives, swelling of the face, lips, or tongue -breathing problems -diarrhea that continues or is severe -lump or swelling on the neck -severe nausea -signs and symptoms of infection like fever or chills; cough; sore throat; pain or trouble passing urine -signs and symptoms of low blood sugar such as feeling anxious, confusion, dizziness, increased hunger, unusually weak or tired, sweating, shakiness, cold, irritable, headache, blurred vision, fast heartbeat, loss of consciousness -signs and symptoms of kidney injury like trouble passing urine or change in the amount of urine -trouble swallowing -unusual stomach upset or pain -vomiting Side effects that usually do not require medical attention (report to your doctor or health care professional if they continue or are bothersome): -constipation -decreased appetite -diarrhea -fatigue -headache -nausea -pain, redness, or irritation at site where injected -stomach upset -stuffy or runny nose This list may not describe all possible side effects. Call your doctor for medical advice about side effects. You may report side effects to FDA at 1-800-FDA-1088. Where should I keep my medicine? Keep out of the reach of children. Store unopened pen in a refrigerator between 2 and 8 degrees C (36 and 46 degrees F). Do not freeze or use if the medicine has been frozen. Protect from light and excessive heat.  After you first use the pen, it can be stored at room temperature between 15 and 30 degrees C (59 and 86 degrees F) or in a refrigerator. Throw away your used pen after 30 days or after the expiration date, whichever comes first. Do not store your pen with the needle attached. If the needle is left on, medicine may leak from the pen. NOTE: This sheet is a summary. It may not cover all possible information. If you have questions about this medicine, talk to your doctor, pharmacist, or health care provider.  2018 Elsevier/Gold Standard (2017-01-01 14:41:37)   Sinus Headache A sinus headache happens when your sinuses become clogged or swollen. You may feel pain or pressure in your face, forehead, ears, or upper teeth. Sinus headaches can be mild or  severe. Follow these instructions at home:  Take medicines only as told by your doctor.  If you were given an antibiotic medicine, finish all of it even if you start to feel better.  Use a nose spray if you feel stuffed up (congested).  If told, apply a warm, moist washcloth to your face to help lessen pain. Contact a doctor if:  You get headaches more than one time each week.  Light or sound bothers you.  You have a fever.  You feel sick to your stomach (nauseous) or you throw up (vomit).  Your headaches do not get better with treatment. Get help right away if:  You have trouble seeing.  You suddenly have very bad pain in your face or head.  You start to twitch or shake (seizure).  You are confused.  You have a stiff neck. This information is not intended to replace advice given to you by your health care provider. Make sure you discuss any questions you have with your health care provider. Document Released: 04/16/2011 Document Revised: 08/10/2016 Document Reviewed: 12/11/2014 Elsevier Interactive Patient Education  2018 [place] 2 sprays each nostril daily for 1 week.

## 2018-05-19 NOTE — Progress Notes (Signed)
Subjective:     Lauren Melton is a 32 y.o. female and is here for a comprehensive physical exam. The patient reports no problems.  Social History   Socioeconomic History  . Marital status: Single    Spouse name: Not on file  . Number of children: Not on file  . Years of education: Not on file  . Highest education level: Not on file  Occupational History  . Not on file  Social Needs  . Financial resource strain: Not on file  . Food insecurity:    Worry: Not on file    Inability: Not on file  . Transportation needs:    Medical: Not on file    Non-medical: Not on file  Tobacco Use  . Smoking status: Never Smoker  . Smokeless tobacco: Never Used  Substance and Sexual Activity  . Alcohol use: No  . Drug use: No  . Sexual activity: Yes  Lifestyle  . Physical activity:    Days per week: Not on file    Minutes per session: Not on file  . Stress: Not on file  Relationships  . Social connections:    Talks on phone: Not on file    Gets together: Not on file    Attends religious service: Not on file    Active member of club or organization: Not on file    Attends meetings of clubs or organizations: Not on file    Relationship status: Not on file  . Intimate partner violence:    Fear of current or ex partner: Not on file    Emotionally abused: Not on file    Physically abused: Not on file    Forced sexual activity: Not on file  Other Topics Concern  . Not on file  Social History Narrative  . Not on file   Health Maintenance  Topic Date Due  . INFLUENZA VACCINE  07/29/2018  . PAP SMEAR  05/19/2021  . TETANUS/TDAP  09/28/2025  . HIV Screening  Completed    The following portions of the patient's history were reviewed and updated as appropriate: allergies, current medications, past family history, past medical history, past social history, past surgical history and problem list.  Review of Systems A comprehensive review of systems was negative.   Objective:    BP 134/81   Pulse 80   Temp 98.8 F (37.1 C) (Oral)   Wt 289 lb (131.1 kg)   BMI 45.26 kg/m  General appearance: alert, cooperative, appears stated age and morbidly obese Head: Normocephalic, without obvious abnormality, atraumatic Eyes: conjunctivae/corneas clear. PERRL, EOM's intact. Fundi benign. Ears: normal TM's and external ear canals both ears Nose: Nares normal. Septum midline. Mucosa normal. No drainage or sinus tenderness. Throat: lips, mucosa, and tongue normal; teeth and gums normal Neck: no adenopathy, no carotid bruit, no JVD, supple, symmetrical, trachea midline and thyroid not enlarged, symmetric, no tenderness/mass/nodules Back: symmetric, no curvature. ROM normal. No CVA tenderness. Lungs: clear to auscultation bilaterally Breasts: normal appearance, no masses or tenderness Heart: regular rate and rhythm, S1, S2 normal, no murmur, click, rub or gallop Abdomen: soft, non-tender; bowel sounds normal; no masses,  no organomegaly Pelvic: cervix normal in appearance, external genitalia normal, no adnexal masses or tenderness, no cervical motion tenderness, uterus normal size, shape, and consistency and vagina normal without discharge Extremities: extremities normal, atraumatic, no cyanosis or edema Pulses: 2+ and symmetric Skin: Skin color, texture, turgor normal. No rashes or lesions Lymph nodes: Cervical, supraclavicular, and axillary nodes normal.  Neurologic: Alert and oriented X 3, normal strength and tone. Normal symmetric reflexes. Normal coordination and gait    Assessment:    Healthy female exam.      Plan:    [name] KitchenMarland KitchenSamathia was seen today for annual exam.  Diagnoses and all orders for this visit:  Routine physical examination -     Lipid Panel w/reflex Direct LDL -     COMPLETE METABOLIC PANEL WITH GFR -     TSH -     CBC with Differential/Platelet -     Fe+TIBC+Fer -     HIV antibody (with reflex) -     RPR -     Hepatitis panel, acute -     Vitamin D  1,25 dihydroxy -     B12 -     bisoprolol-hydrochlorothiazide (ZIAC) 5-6.25 MG tablet; Take 1 tablet by mouth daily.  Screening for diabetes mellitus -     COMPLETE METABOLIC PANEL WITH GFR  Screening for lipid disorders -     Lipid Panel w/reflex Direct LDL  Screening examination for STD (sexually transmitted disease) -     HIV antibody (with reflex) -     RPR -     Hepatitis panel, acute -     C. trachomatis/N. gonorrhoeae RNA  Morbid obesity (HCC)  Slow transit constipation  Pap test, as part of routine gynecological examination -     Cytology - PAP  Essential hypertension -     bisoprolol-hydrochlorothiazide (ZIAC) 5-6.25 MG tablet; Take 1 tablet by mouth daily.  .. Depression screen [facility] 2/9 05/19/2018 06/17/2017  Decreased Interest 0 0  Down, Depressed, Hopeless 0 0  PHQ - 2 Score 0 0  Altered sleeping 0 -  Tired, decreased energy 0 -  Change in appetite 0 -  Feeling bad or failure about yourself  0 -  Trouble concentrating 0 -  Moving slowly or fidgety/restless 0 -  Suicidal thoughts 0 -  PHQ-9 Score 0 -   .[name] Kitchen Discussed 150 minutes of exercise a week.  Encouraged vitamin D 1000 units and Calcium 1300mg  or 4 servings of dairy a day.  Pap done today with STD screenings.   [name] Kitchen.Discussed low carb diet with 1500 calories and 80g of protein.  Exercising at least 150 minutes a week.  [employer] could be a Microbiologist.  Discussed medications. Certainly can follow up if interested.   Mentioned bowel movements are ever 3 days and not normal. Consider adding probiotics, increasing fiber and water. miralax as needed. Follow up if worsening or not improving.   See After Visit Summary for Counseling Recommendations

## 2018-05-20 LAB — C. TRACHOMATIS/N. GONORRHOEAE RNA
C. trachomatis RNA, TMA: NOT DETECTED
N. gonorrhoeae RNA, TMA: NOT DETECTED

## 2018-05-21 LAB — CBC WITH DIFFERENTIAL/PLATELET
BASOS ABS: 68 {cells}/uL (ref 0–200)
BASOS PCT: 1.1 %
EOS ABS: 149 {cells}/uL (ref 15–500)
EOS PCT: 2.4 %
HEMATOCRIT: 33.7 % — AB (ref 35.0–45.0)
HEMOGLOBIN: 11.8 g/dL (ref 11.7–15.5)
LYMPHS ABS: 2877 {cells}/uL (ref 850–3900)
MCH: 28.4 pg (ref 27.0–33.0)
MCHC: 35 g/dL (ref 32.0–36.0)
MCV: 81 fL (ref 80.0–100.0)
MPV: 9.6 fL (ref 7.5–12.5)
Monocytes Relative: 6.5 %
NEUTROS ABS: 2703 {cells}/uL (ref 1500–7800)
Neutrophils Relative %: 43.6 %
Platelets: 341 10*3/uL (ref 140–400)
RBC: 4.16 10*6/uL (ref 3.80–5.10)
RDW: 14.6 % (ref 11.0–15.0)
Total Lymphocyte: 46.4 %
WBC mixed population: 403 cells/uL (ref 200–950)
WBC: 6.2 10*3/uL (ref 3.8–10.8)

## 2018-05-21 LAB — IRON,TIBC AND FERRITIN PANEL
%SAT: 22 % (calc) (ref 11–50)
Ferritin: 58 ng/mL (ref 10–154)
Iron: 61 ug/dL (ref 40–190)
TIBC: 278 ug/dL (ref 250–450)

## 2018-05-21 LAB — HEPATITIS PANEL, ACUTE
HEP B C IGM: NONREACTIVE
HEP B S AG: NONREACTIVE
HEP C AB: NONREACTIVE
Hep A IgM: NONREACTIVE
SIGNAL TO CUT-OFF: 0.03 (ref <1.00)

## 2018-05-21 LAB — COMPLETE METABOLIC PANEL WITH GFR
AG Ratio: 1.3 (calc) (ref 1.0–2.5)
ALT: 13 U/L (ref 6–29)
AST: 13 U/L (ref 10–30)
Albumin: 4.3 g/dL (ref 3.6–5.1)
Alkaline phosphatase (APISO): 61 U/L (ref 33–115)
BUN: 7 mg/dL (ref 7–25)
CALCIUM: 9.8 mg/dL (ref 8.6–10.2)
CO2: 27 mmol/L (ref 20–32)
Chloride: 102 mmol/L (ref 98–110)
Creat: 0.62 mg/dL (ref 0.50–1.10)
GFR, EST AFRICAN AMERICAN: 137 mL/min/{1.73_m2} (ref >=60)
GFR, EST NON AFRICAN AMERICAN: 118 mL/min/{1.73_m2} (ref >=60)
GLUCOSE: 85 mg/dL (ref 65–99)
Globulin: 3.2 g/dL (calc) (ref 1.9–3.7)
Potassium: 4 mmol/L (ref 3.5–5.3)
Sodium: 138 mmol/L (ref 135–146)
TOTAL PROTEIN: 7.5 g/dL (ref 6.1–8.1)
Total Bilirubin: 0.5 mg/dL (ref 0.2–1.2)

## 2018-05-21 LAB — VITAMIN D 1,25 DIHYDROXY
VITAMIN D3 1, 25 (OH): 50 pg/mL
Vitamin D 1, 25 (OH)2 Total: 50 pg/mL (ref 18–72)

## 2018-05-21 LAB — CYTOLOGY - PAP
Diagnosis: NEGATIVE
HPV: NOT DETECTED

## 2018-05-21 LAB — LIPID PANEL W/REFLEX DIRECT LDL
Cholesterol: 202 mg/dL — ABNORMAL HIGH (ref <200)
HDL: 43 mg/dL — AB (ref >50)
LDL CHOLESTEROL (CALC): 139 mg/dL — AB
NON-HDL CHOLESTEROL (CALC): 159 mg/dL — AB (ref <130)
TRIGLYCERIDES: 97 mg/dL (ref <150)
Total CHOL/HDL Ratio: 4.7 (calc) (ref <5.0)

## 2018-05-21 LAB — HIV ANTIBODY (ROUTINE TESTING W REFLEX): HIV: NONREACTIVE

## 2018-05-21 LAB — TSH: TSH: 0.82 m[IU]/L

## 2018-05-21 LAB — VITAMIN B12: Vitamin B-12: 410 pg/mL (ref 200–1100)

## 2018-05-21 LAB — RPR: RPR Ser Ql: NONREACTIVE

## 2018-05-25 NOTE — Progress Notes (Signed)
Call pt:  PAP- no HPV and no abnormal cells. Repeat pap in 3 years.  STD screening negative. Hep screening negative.  B12/vitamin D looks good.  Cholesterol worse than last year. Overall risk still low. Need to work on low fat diet and exercise. Recheck in 1 year.

## 2018-05-26 ENCOUNTER — Encounter: Payer: Self-pay | Admitting: Physician Assistant

## 2018-05-26 DIAGNOSIS — K5901 Slow transit constipation: Secondary | ICD-10-CM | POA: Insufficient documentation

## 2018-05-26 MED ORDER — BISOPROLOL-HYDROCHLOROTHIAZIDE 5-6.25 MG PO TABS: 1.0000 | ORAL_TABLET | Freq: Every day | ORAL | 3 refills | Status: DC

## 2018-09-07 ENCOUNTER — Encounter: Payer: Self-pay | Admitting: Physician Assistant

## 2018-09-07 ENCOUNTER — Other Ambulatory Visit: Payer: Self-pay | Admitting: Physician Assistant

## 2018-09-07 ENCOUNTER — Ambulatory Visit: Payer: BLUE CROSS/BLUE SHIELD | Admitting: Physician Assistant

## 2018-09-07 VITALS — BP 119/90 | HR 63 | Temp 98.3°F | Wt 296.0 lb

## 2018-09-07 DIAGNOSIS — I10 Essential (primary) hypertension: Secondary | ICD-10-CM | POA: Diagnosis not present

## 2018-09-07 DIAGNOSIS — R350 Frequency of micturition: Secondary | ICD-10-CM

## 2018-09-07 DIAGNOSIS — F430 Acute stress reaction: Secondary | ICD-10-CM | POA: Diagnosis not present

## 2018-09-07 DIAGNOSIS — N898 Other specified noninflammatory disorders of vagina: Secondary | ICD-10-CM

## 2018-09-07 LAB — POCT URINALYSIS DIPSTICK
Bilirubin, UA: NEGATIVE
GLUCOSE UA: NEGATIVE
Ketones, UA: NEGATIVE
LEUKOCYTES UA: NEGATIVE
NITRITE UA: NEGATIVE
Protein, UA: NEGATIVE
RBC UA: NEGATIVE
SPEC GRAV UA: 1.015 (ref 1.010–1.025)
Urobilinogen, UA: 0.2 E.U./dL
pH, UA: 7 (ref 5.0–8.0)

## 2018-09-07 MED ORDER — FLUCONAZOLE 150 MG PO TABS: 150.0000 mg | ORAL_TABLET | Freq: Once | ORAL | 0 refills | Status: AC

## 2018-09-07 MED ORDER — BUPROPION HCL ER (XL) 150 MG PO TB24: 150.0000 mg | ORAL_TABLET | ORAL | 2 refills | Status: DC

## 2018-09-07 MED ORDER — BISOPROLOL-HYDROCHLOROTHIAZIDE 5-6.25 MG PO TABS: 1.0000 | ORAL_TABLET | Freq: Every day | ORAL | 3 refills | Status: DC

## 2018-09-07 NOTE — Progress Notes (Signed)
Please culture.

## 2018-09-07 NOTE — Progress Notes (Signed)
   Subjective:    Patient ID: Lauren Melton, female    DOB: 1986/10/20, 32 y.o.   MRN: [id number]  HPI  Pt is a 32 yo female with HTN who presents to the clinic with vaginal discharge and increased urinary frequency with some lower abdominal pain for the last 2 weeks. She denies any flank pain, fever, chills, n/v/d. She had an odor for few days but seemed to have gone away. She has had BV in the past. She is having some pelvic dysfunction and external vaginal irritation. She is sexual active with one partner. She admits she does not use condoms all the time.   .. Active Ambulatory Problems    Diagnosis Date Noted  . Cesarean delivery delivered 01/14/2016  . Essential hypertension 08/13/2016  . History of benign brain tumor 08/13/2016  . 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 No energy 08/27/2016  . Morbid obesity ([place]) 08/27/2016  . Vitamin D insufficiency 08/29/2016  . Slow transit constipation 05/26/2018   Resolved Ambulatory Problems    Diagnosis Date Noted  . No Resolved Ambulatory Problems   Past Medical History:  Diagnosis Date  . HSV-2 seropositive   . Hypertension       Review of Systems See HPI.     Objective:   Physical Exam  Constitutional: She is oriented to person, place, and time. She appears well-developed and well-nourished.  Obese.   Cardiovascular: Normal rate and regular rhythm.  Pulmonary/Chest: Effort normal and breath sounds normal.  NO CVA tenderness.   Abdominal: Soft. Bowel sounds are normal. She exhibits no distension and no mass. There is tenderness. There is no rebound and no guarding. No hernia.  Some suprapubic tenderness.   Neurological: She is alert and oriented to person, place, and time.  Psychiatric: Her behavior is normal.          Assessment & Plan:  [name] KitchenMarland KitchenSamathia was seen today for urinary frequency.  Diagnoses and all orders for this visit:  Urinary frequency -     POCT urinalysis dipstick -     Urine Culture; Future -      Urine Culture -     UNLABELED  Vaginal discharge -     POCT urinalysis dipstick -     Urine Culture; Future -     Urine Culture -     fluconazole (DIFLUCAN) 150 MG tablet; Take 1 tablet (150 mg total) by mouth once for 1 dose. Repeat in 48-72 hours. -     WET PREP FOR TRICH, YEAST, CLUE -     C. trachomatis/N. gonorrhoeae RNA -     UNLABELED  Essential hypertension -     bisoprolol-hydrochlorothiazide (ZIAC) 5-6.25 MG tablet; Take 1 tablet by mouth daily.  Acute stress reaction -     buPROPion (WELLBUTRIN XL) 150 MG 24 hr tablet; Take 1 tablet (150 mg total) by mouth every morning.   Will send off wet prep, STI testing. Appears most like yeast. Sent diflucan. UA was negative for any signs of infection. Will send for culture.   BP medication refilled today.   Pt is under a lot of stress. Discussed exercise, medication, reaching out to people in your community.  Will try wellbutrin. Discussed side effects. If makes mood worse stop medicaiton and call office. Follow up in 4-6 weeks.

## 2018-09-08 ENCOUNTER — Encounter: Payer: Self-pay | Admitting: Physician Assistant

## 2018-09-08 LAB — UNLABELED

## 2018-09-08 NOTE — Progress Notes (Signed)
Can we call lab and give information to be correctly labeled?

## 2018-09-09 ENCOUNTER — Encounter: Payer: Self-pay | Admitting: Physician Assistant

## 2018-09-09 DIAGNOSIS — F430 Acute stress reaction: Secondary | ICD-10-CM | POA: Insufficient documentation

## 2018-09-09 LAB — URINE CULTURE
MICRO NUMBER:: 91084891
SPECIMEN QUALITY:: ADEQUATE

## 2018-09-09 MED ORDER — METRONIDAZOLE 500 MG PO TABS: 500.0000 mg | ORAL_TABLET | Freq: Two times a day (BID) | ORAL | 0 refills | Status: DC

## 2018-09-09 NOTE — Progress Notes (Signed)
Call pt: no urinary tract infection detected. Still waiting on other cultures. How are symptoms today?

## 2018-09-09 NOTE — Addendum Note (Signed)
Addended by: Donella Stade on: 09/09/2018 11:18 AM   Modules accepted: Orders

## 2018-09-09 NOTE — Progress Notes (Signed)
Can we find out why the GC/Chlamydia and wet prep are not back yet?  In meantime I will send over treatment for BV since taking so long to get results. We will see if this helps.

## 2018-09-12 ENCOUNTER — Encounter: Payer: Self-pay | Admitting: Physician Assistant

## 2018-09-13 ENCOUNTER — Ambulatory Visit: Payer: BLUE CROSS/BLUE SHIELD | Admitting: Physician Assistant

## 2018-09-13 NOTE — Telephone Encounter (Signed)
Specimen was not labeled. Requested a form to be filled out by [employer].

## 2018-09-13 NOTE — Telephone Encounter (Signed)
Called [facility] and spoke with Dorann Ou and she stated that the order has been processed and waiting on more specimen since its in a different department.

## 2018-09-15 ENCOUNTER — Other Ambulatory Visit: Payer: Self-pay | Admitting: Physician Assistant

## 2018-09-15 DIAGNOSIS — R300 Dysuria: Secondary | ICD-10-CM

## 2018-09-15 DIAGNOSIS — N898 Other specified noninflammatory disorders of vagina: Secondary | ICD-10-CM

## 2018-09-15 DIAGNOSIS — Z8742 Personal history of other diseases of the female genital tract: Secondary | ICD-10-CM

## 2018-09-15 LAB — C. TRACHOMATIS/N. GONORRHOEAE RNA

## 2018-09-15 LAB — PAT ID TIQ DOC

## 2018-09-15 LAB — WET PREP FOR TRICH, YEAST, CLUE
MICRO NUMBER:: 91082951
Specimen Quality: ADEQUATE

## 2018-09-15 NOTE — Progress Notes (Signed)
Call pt: no trich, no BV. No yeast seen. Vaginal discharge appears normal.

## 2018-09-15 NOTE — Progress Notes (Signed)
Urine labs placed and faxed to lab per provider request.

## 2018-09-16 LAB — WET PREP FOR TRICH, YEAST, CLUE

## 2018-09-16 LAB — C. TRACHOMATIS/N. GONORRHOEAE RNA
C. trachomatis RNA, TMA: NOT DETECTED
N. gonorrhoeae RNA, TMA: NOT DETECTED

## 2018-09-17 NOTE — Progress Notes (Signed)
No GC/chlamydia

## 2018-09-18 LAB — C. TRACHOMATIS/N. GONORRHOEAE RNA
C. trachomatis RNA, TMA: NOT DETECTED
N. gonorrhoeae RNA, TMA: NOT DETECTED

## 2018-09-18 LAB — PAT ID TIQ DOC: Test Affected: 11363

## 2018-11-22 ENCOUNTER — Ambulatory Visit (INDEPENDENT_AMBULATORY_CARE_PROVIDER_SITE_OTHER): Payer: BLUE CROSS/BLUE SHIELD | Admitting: Physician Assistant

## 2018-11-22 ENCOUNTER — Encounter: Payer: Self-pay | Admitting: Physician Assistant

## 2018-11-22 VITALS — BP 130/87 | HR 76 | Temp 98.8°F | Wt 299.0 lb

## 2018-11-22 DIAGNOSIS — J101 Influenza due to other identified influenza virus with other respiratory manifestations: Secondary | ICD-10-CM | POA: Diagnosis not present

## 2018-11-22 DIAGNOSIS — R05 Cough: Secondary | ICD-10-CM

## 2018-11-22 DIAGNOSIS — J029 Acute pharyngitis, unspecified: Secondary | ICD-10-CM | POA: Diagnosis not present

## 2018-11-22 DIAGNOSIS — R6889 Other general symptoms and signs: Secondary | ICD-10-CM

## 2018-11-22 DIAGNOSIS — R059 Cough, unspecified: Secondary | ICD-10-CM

## 2018-11-22 LAB — POCT INFLUENZA A/B
INFLUENZA A, POC: NEGATIVE
INFLUENZA B, POC: POSITIVE — AB

## 2018-11-22 LAB — POCT RAPID STREP A (OFFICE): RAPID STREP A SCREEN: NEGATIVE

## 2018-11-22 MED ORDER — FLUTICASONE PROPIONATE 50 MCG/ACT NA SUSP: 2.0000 | Freq: Every day | NASAL | 6 refills | Status: DC

## 2018-11-22 MED ORDER — OSELTAMIVIR PHOSPHATE 75 MG PO CAPS: 75.0000 mg | ORAL_CAPSULE | Freq: Two times a day (BID) | ORAL | 0 refills | Status: DC

## 2018-11-22 MED ORDER — HYDROCODONE-HOMATROPINE 5-1.5 MG/5ML PO SYRP: 5.0000 mL | ORAL_SOLUTION | Freq: Two times a day (BID) | ORAL | 0 refills | Status: DC

## 2018-11-22 MED ORDER — AMOXICILLIN-POT CLAVULANATE 875-125 MG PO TABS: 1.0000 | ORAL_TABLET | Freq: Two times a day (BID) | ORAL | 0 refills | Status: DC

## 2018-11-22 NOTE — Patient Instructions (Addendum)

## 2018-11-22 NOTE — Progress Notes (Addendum)
   Subjective:    Patient ID: Lauren Melton, female    DOB: 1986/05/16, 32 y.o.   MRN: [id number]  HPI Pt is a 32 yo female who presents to the clinic with 2 days of body aches, sinus pressure, congestion, chills, fatigue, cough.  Her son has a cough but no other symptoms.  She denies any shortness of breath or wheezing.  She is tried over-the-counter TheraFlu with little benefit.  .. Active Ambulatory Problems    Diagnosis Date Noted  . Cesarean delivery delivered 01/14/2016  . Essential hypertension 08/13/2016  . History of benign brain tumor 08/13/2016  . 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 No energy 08/27/2016  . Morbid obesity ([place]) 08/27/2016  . Vitamin D insufficiency 08/29/2016  . Slow transit constipation 05/26/2018  . Acute stress reaction 09/09/2018   Resolved Ambulatory Problems    Diagnosis Date Noted  . No Resolved Ambulatory Problems   Past Medical History:  Diagnosis Date  . HSV-2 seropositive   . Hypertension       Review of Systems    see HPI.  Objective:   Physical Exam  Constitutional: She is oriented to person, place, and time. She appears well-developed and well-nourished.  HENT:  Head: Normocephalic and atraumatic.  Right Ear: External ear normal.  Left Ear: External ear normal.  TM's erythematous.  Tenderness over the maxillary sinu  Eyes: Right eye exhibits no discharge. Left eye exhibits no discharge.  Conjunctiva injected minimally.   Cardiovascular: Normal rate and regular rhythm.  Pulmonary/Chest: Effort normal and breath sounds normal.  Lymphadenopathy:    She has cervical adenopathy.  Neurological: She is alert and oriented to person, place, and time.  Skin: No rash noted.  Psychiatric: She has a normal mood and affect. Her behavior is normal.          Assessment & Plan:  [name] KitchenMarland KitchenDiagnoses and all orders for this visit:  Influenza B -     fluticasone (FLONASE) 50 MCG/ACT nasal spray; Place 2 sprays into both nostrils daily. -      oseltamivir (TAMIFLU) 75 MG capsule; Take 1 capsule (75 mg total) by mouth 2 (two) times daily. -     HYDROcodone-homatropine (HYCODAN) 5-1.5 MG/5ML syrup; Take 5 mLs by mouth every 12 (twelve) hours.  Flu-like symptoms -     fluticasone (FLONASE) 50 MCG/ACT nasal spray; Place 2 sprays into both nostrils daily. -     POCT Influenza A/B -     POCT rapid strep A  Sore throat  Cough -     HYDROcodone-homatropine (HYCODAN) 5-1.5 MG/5ML syrup; Take 5 mLs by mouth every 12 (twelve) hours.  Other orders -     Discontinue: amoxicillin-clavulanate (AUGMENTIN) 875-125 MG tablet; Take 1 tablet by mouth 2 (two) times daily.  .. Results for orders placed or performed in visit on 11/22/18  POCT Influenza A/B  Result Value Ref Range   Influenza A, POC Negative Negative   Influenza B, POC Positive (A) Negative  POCT rapid strep A  Result Value Ref Range   Rapid Strep A Screen Negative Negative   Started tamiflu. She request her son be treated with prophylactic. Will calls peds office to see if they will send something in.  Dr. Cletus Gash-  Aiden Marvel Plan 01/14/16 cough with no other symptoms.   HO with other symptomatic care. Hycodan for cough at bedtime. Rest and hydrate. Written out for today and tomorrow. Follow up as needed.

## 2018-11-22 NOTE — Addendum Note (Signed)
Addended by: Dory Larsen on: 11/22/2018 11:57 AM   Modules accepted: Orders

## 2018-11-24 ENCOUNTER — Encounter: Payer: Self-pay | Admitting: Physician Assistant

## 2018-11-24 ENCOUNTER — Ambulatory Visit (INDEPENDENT_AMBULATORY_CARE_PROVIDER_SITE_OTHER): Payer: BLUE CROSS/BLUE SHIELD | Admitting: Physician Assistant

## 2018-11-24 VITALS — BP 141/76 | HR 88 | Temp 98.0°F | Wt 293.2 lb

## 2018-11-24 DIAGNOSIS — R51 Headache: Secondary | ICD-10-CM

## 2018-11-24 DIAGNOSIS — J018 Other acute sinusitis: Secondary | ICD-10-CM

## 2018-11-24 DIAGNOSIS — J101 Influenza due to other identified influenza virus with other respiratory manifestations: Secondary | ICD-10-CM | POA: Diagnosis not present

## 2018-11-24 DIAGNOSIS — R519 Headache, unspecified: Secondary | ICD-10-CM

## 2018-11-24 DIAGNOSIS — J209 Acute bronchitis, unspecified: Secondary | ICD-10-CM | POA: Diagnosis not present

## 2018-11-24 MED ORDER — KETOROLAC TROMETHAMINE 30 MG/ML IJ SOLN: 30.0000 mg | Freq: Once | INTRAMUSCULAR | Status: DC

## 2018-11-24 MED ORDER — AMOXICILLIN-POT CLAVULANATE 875-125 MG PO TABS: 1.0000 | ORAL_TABLET | Freq: Two times a day (BID) | ORAL | 0 refills | Status: DC

## 2018-11-24 MED ORDER — KETOROLAC TROMETHAMINE 30 MG/ML IJ SOLN
30.0000 mg | Freq: Once | INTRAMUSCULAR | Status: AC
  Administered 2018-11-24: 30 mg via INTRAMUSCULAR

## 2018-11-24 MED ORDER — METHYLPREDNISOLONE SODIUM SUCC 125 MG IJ SOLR
125.0000 mg | Freq: Once | INTRAMUSCULAR | Status: AC
  Administered 2018-11-24: 125 mg via INTRAMUSCULAR

## 2018-11-24 MED ORDER — ALBUTEROL SULFATE HFA 108 (90 BASE) MCG/ACT IN AERS: 2.0000 | INHALATION_SPRAY | Freq: Four times a day (QID) | RESPIRATORY_TRACT | 0 refills | Status: DC | PRN

## 2018-11-24 NOTE — Progress Notes (Signed)
Subjective:    Patient ID: Lauren Melton, female    DOB: [birth year], 32 y.o.   MRN: [id number]  HPI Patient is a 32 year old female who was diagnosed with influenza B on Monday, 3 days ago, she continues to feel terrible.  She has a terrible headache that will not go away. She has a lot of sinus pressure as well. She is using Aleve with little relief.  She is also coughing and feels like her chest is tight.  She is taking Tamiflu and Flonase daily.  She admits to having a few nosebleeds as well.  She denies any fever since starting the Tamiflu.  She continues to feel weak and her body aching.  She wonders if there is anything else we can do.  .. Active Ambulatory Problems    Diagnosis Date Noted  . Cesarean delivery delivered 01/14/2016  . Essential hypertension 08/13/2016  . History of benign brain tumor 08/13/2016  . 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 No energy 08/27/2016  . Morbid obesity ([place]) 08/27/2016  . Vitamin D insufficiency 08/29/2016  . Slow transit constipation 05/26/2018  . Acute stress reaction 09/09/2018   Resolved Ambulatory Problems    Diagnosis Date Noted  . No Resolved Ambulatory Problems   Past Medical History:  Diagnosis Date  . HSV-2 seropositive   . Hypertension       Review of Systems    see HPI.  Objective:   Physical Exam  Constitutional: She appears ill.  HENT:  Head: Normocephalic and atraumatic.  TM's erythematous.  Tenderness over frontal and maxillary sinuses.  Oropharynx red with PND.  Eyes: Pupils are equal, round, and reactive to light. EOM are normal.  Neck: Normal range of motion. Neck supple.  Cardiovascular: Normal rate.  Pulmonary/Chest: Effort normal and breath sounds normal.  Bilateral rhonchi.   Psychiatric: She has a normal mood and affect.          Assessment & Plan:  [name] KitchenMarland KitchenSamathia was seen today for headache.  Diagnoses and all orders for this visit:  Influenza B -     Discontinue: ketorolac (TORADOL) 30 MG/ML  injection 30 mg -     methylPREDNISolone sodium succinate (SOLU-MEDROL) 125 mg/2 mL injection 125 mg -     ketorolac (TORADOL) 30 MG/ML injection 30 mg  Acute intractable headache, unspecified headache type -     amoxicillin-clavulanate (AUGMENTIN) 875-125 MG tablet; Take 1 tablet by mouth 2 (two) times daily. -     Discontinue: ketorolac (TORADOL) 30 MG/ML injection 30 mg -     methylPREDNISolone sodium succinate (SOLU-MEDROL) 125 mg/2 mL injection 125 mg -     ketorolac (TORADOL) 30 MG/ML injection 30 mg  Acute bronchitis, unspecified organism -     albuterol (PROVENTIL HFA;VENTOLIN HFA) 108 (90 Base) MCG/ACT inhaler; Inhale 2 puffs into the lungs every 6 (six) hours as needed for wheezing or shortness of breath. -     Discontinue: ketorolac (TORADOL) 30 MG/ML injection 30 mg -     methylPREDNISolone sodium succinate (SOLU-MEDROL) 125 mg/2 mL injection 125 mg -     ketorolac (TORADOL) 30 MG/ML injection 30 mg  Acute non-recurrent sinusitis of other sinus -     amoxicillin-clavulanate (AUGMENTIN) 875-125 MG tablet; Take 1 tablet by mouth 2 (two) times daily.   Given shot of Solu-Medrol and Toradol in office today to help with headache and inflammation.  Went ahead and send an albuterol inhaler to help open up her lungs and potentially help with cough.  Sent Augmentin  to start for sinus infection.  Stop Flonase to see if help with nosebleed.  Continue to rest and hydrate.  Reassured that her vitals are okay today.  Influenza virus can take a few days to feel better.  Follow-up on Monday if no improvement.

## 2018-11-24 NOTE — Patient Instructions (Signed)

## 2018-12-31 NOTE — Telephone Encounter (Signed)
Addressed.

## 2019-02-16 DIAGNOSIS — H5213 Myopia, bilateral: Secondary | ICD-10-CM | POA: Diagnosis not present

## 2019-03-07 ENCOUNTER — Telehealth: Payer: Self-pay | Admitting: Physician Assistant

## 2019-03-07 NOTE — Telephone Encounter (Signed)
Pt came with [facility] statement. Labs in office 09/08/19 had no label and missing info so it was unable to be processed. Pt was notified to come back in and repeat labs. Pt states ins paid the repeat labs and is now billing for the labs that were not able to be processed. Forwarded to lab mgr for review. Call pt with resolve [phone].

## 2019-03-08 NOTE — Telephone Encounter (Signed)
Spoke to [place] in the [facility] lab in [place]. She forwarded to Areta Haber area Freight forwarder for resolve.

## 2019-07-11 ENCOUNTER — Encounter: Payer: Self-pay | Admitting: Physician Assistant

## 2019-07-11 ENCOUNTER — Other Ambulatory Visit: Payer: Self-pay

## 2019-07-11 ENCOUNTER — Ambulatory Visit: Payer: BLUE CROSS/BLUE SHIELD | Admitting: Physician Assistant

## 2019-07-11 VITALS — BP 134/69 | HR 76 | Temp 98.6°F | Ht 67.0 in | Wt 294.0 lb

## 2019-07-11 DIAGNOSIS — N949 Unspecified condition associated with female genital organs and menstrual cycle: Secondary | ICD-10-CM | POA: Diagnosis not present

## 2019-07-11 DIAGNOSIS — G4452 New daily persistent headache (NDPH): Secondary | ICD-10-CM

## 2019-07-11 DIAGNOSIS — Z86011 Personal history of benign neoplasm of the brain: Secondary | ICD-10-CM

## 2019-07-11 DIAGNOSIS — B9689 Other specified bacterial agents as the cause of diseases classified elsewhere: Secondary | ICD-10-CM

## 2019-07-11 DIAGNOSIS — R350 Frequency of micturition: Secondary | ICD-10-CM

## 2019-07-11 DIAGNOSIS — N760 Acute vaginitis: Secondary | ICD-10-CM

## 2019-07-11 LAB — POCT URINALYSIS DIPSTICK
Bilirubin, UA: NEGATIVE
Blood, UA: NEGATIVE
Glucose, UA: NEGATIVE
Ketones, UA: NEGATIVE
Leukocytes, UA: NEGATIVE
Nitrite, UA: NEGATIVE
Protein, UA: NEGATIVE
Spec Grav, UA: 1.015 (ref 1.010–1.025)
Urobilinogen, UA: 0.2 E.U./dL
pH, UA: 7 (ref 5.0–8.0)

## 2019-07-11 MED ORDER — METRONIDAZOLE 500 MG PO TABS: 500.0000 mg | ORAL_TABLET | Freq: Two times a day (BID) | ORAL | 0 refills | Status: DC

## 2019-07-11 NOTE — Progress Notes (Signed)
Subjective:    Patient ID: Lauren Melton, female    DOB: 09/05/1986, 33 y.o.   MRN: [id number]  HPI  Pt is a 33 yo female with hx of BV who presents to the clinic with vaginal discharge, odor, and increase in urinary frequency. Symptoms started 1 week ago and felt like they kind of went away and then came back. Not sexually active for a while. No abdominal or flank pain. No fever, chills, nausea. No itching.   She does mention worsening headaches for the last few months. She does have hx of benign brain tumor that was removed in 2002. HA are on the right frontal and radiate down in right occipital region. She recently had eye exam and glasses were ordered. No other vision changes. No upper ext weakness. She is taking tylenol and ibuprofen daily. She denies any photo/phon sensitivity, nausea, or vomiting.   .. Active Ambulatory Problems    Diagnosis Date Noted  . Cesarean delivery delivered 01/14/2016  . Essential hypertension 08/13/2016  . History of benign brain tumor 08/13/2016  . 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 No energy 08/27/2016  . Morbid obesity ([place]) 08/27/2016  . Vitamin D insufficiency 08/29/2016  . Slow transit constipation 05/26/2018  . Acute stress reaction 09/09/2018   Resolved Ambulatory Problems    Diagnosis Date Noted  . No Resolved Ambulatory Problems   Past Medical History:  Diagnosis Date  . HSV-2 seropositive   . Hypertension         Review of Systems See HPI.     Objective:   Physical Exam Vitals signs reviewed.  Constitutional:      Appearance: Normal appearance. She is obese.  HENT:     Head: Normocephalic.  Eyes:     Extraocular Movements: Extraocular movements intact.     Pupils: Pupils are equal, round, and reactive to light.  Cardiovascular:     Rate and Rhythm: Normal rate and regular rhythm.  Pulmonary:     Effort: Pulmonary effort is normal.  Musculoskeletal:     Comments: Upper and lower extremity strength 5/5.    Neurological:     General: No focal deficit present.     Mental Status: She is alert and oriented to person, place, and time.     Cranial Nerves: No cranial nerve deficit.     Motor: No weakness.     Coordination: Coordination normal.     Gait: Gait normal.  Psychiatric:        Mood and Affect: Mood normal.        Behavior: Behavior normal.           Assessment & Plan:  [name] KitchenMarland KitchenSamathia was seen today for vaginal pain.  Diagnoses and all orders for this visit:  Bacterial vaginosis -     metroNIDAZOLE (FLAGYL) 500 MG tablet; Take 1 tablet (500 mg total) by mouth 2 (two) times daily.  Urinary frequency -     POCT Urinalysis Dipstick -     Urine Culture  Vaginal discomfort -     WET PREP FOR TRICH, YEAST, CLUE  History of benign brain tumor  New daily persistent headache -     MR Brain W Wo Contrast   Wet prep confirmed BV. Metronidazole sent. Discussed prevention.   .. Results for orders placed or performed in visit on 07/11/19  Urine Culture   Specimen: Urine  Result Value Ref Range   MICRO NUMBER: [id number]    SPECIMEN QUALITY: Adequate    Sample Source  URINE    STATUS: FINAL    Result: No Growth   WET PREP FOR TRICH, YEAST, CLUE  Result Value Ref Range   MICRO NUMBER: [id number]    Specimen Quality Adequate    SOURCE: NOT GIVEN    Status FINAL    RESULT      No Trichomonas vaginalis seen. No yeast seen Clue cells seen Epithelial Cells Present  POCT Urinalysis Dipstick  Result Value Ref Range   Color, UA yellow    Clarity, UA clear    Glucose, UA Negative Negative   Bilirubin, UA negative    Ketones, UA negative    Spec Grav, UA 1.015 1.010 - 1.025   Blood, UA negative    pH, UA 7.0 5.0 - 8.0   Protein, UA Negative Negative   Urobilinogen, UA 0.2 0.2 or 1.0 E.U./dL   Nitrite, UA negative    Leukocytes, UA Negative Negative   Appearance     Odor     UA negative. Will get culture to confirm no bacterial infection.   Pt is not sexually active and  declines STD testings.   Due to worsening headaches and hx of brain tumor. MRI ordered for evaluation. No neuro findings on exam.

## 2019-07-11 NOTE — Patient Instructions (Signed)

## 2019-07-12 LAB — WET PREP FOR TRICH, YEAST, CLUE
MICRO NUMBER:: 660240
Specimen Quality: ADEQUATE

## 2019-07-12 NOTE — Progress Notes (Signed)
Wet prep showed BV. You already have treatment.

## 2019-07-13 LAB — URINE CULTURE
MICRO NUMBER:: 660396
Result:: NO GROWTH
SPECIMEN QUALITY:: ADEQUATE

## 2019-07-13 NOTE — Progress Notes (Signed)
Urine culture confirmed no UTI.

## 2019-07-14 ENCOUNTER — Telehealth: Payer: Self-pay

## 2019-07-14 DIAGNOSIS — I10 Essential (primary) hypertension: Secondary | ICD-10-CM

## 2019-07-14 NOTE — Telephone Encounter (Signed)
Patient needs labs done for imaging with contrast due to hypertension.

## 2019-07-15 ENCOUNTER — Telehealth: Payer: Self-pay | Admitting: Physician Assistant

## 2019-07-15 DIAGNOSIS — Z0000 Encounter for general adult medical examination without abnormal findings: Secondary | ICD-10-CM

## 2019-07-15 NOTE — Telephone Encounter (Signed)
Thank you :)

## 2019-07-15 NOTE — Telephone Encounter (Signed)
Patient came in asking to do physical blood work and some extra blood work like a  Therapist, occupational. Checked with triage and then was told that she needed to have an appointment with Luvenia Starch to make sure this would be covered by insurance due to no diagnosis. Was given regular physical labs and declined to do them until appointment with [facility].

## 2019-07-18 ENCOUNTER — Encounter: Payer: BC Managed Care – PPO | Admitting: Physician Assistant

## 2019-07-18 DIAGNOSIS — E7800 Pure hypercholesterolemia, unspecified: Secondary | ICD-10-CM | POA: Diagnosis not present

## 2019-07-18 DIAGNOSIS — Z0000 Encounter for general adult medical examination without abnormal findings: Secondary | ICD-10-CM | POA: Diagnosis not present

## 2019-07-19 LAB — COMPLETE METABOLIC PANEL WITH GFR
AG Ratio: 1.5 (calc) (ref 1.0–2.5)
ALT: 10 U/L (ref 6–29)
AST: 10 U/L (ref 10–30)
Albumin: 4.4 g/dL (ref 3.6–5.1)
Alkaline phosphatase (APISO): 66 U/L (ref 31–125)
BUN: 9 mg/dL (ref 7–25)
CO2: 24 mmol/L (ref 20–32)
Calcium: 10 mg/dL (ref 8.6–10.2)
Chloride: 107 mmol/L (ref 98–110)
Creat: 0.7 mg/dL (ref 0.50–1.10)
GFR, Est African American: 131 mL/min/{1.73_m2} (ref >=60)
GFR, Est Non African American: 113 mL/min/{1.73_m2} (ref >=60)
Globulin: 3 g/dL (calc) (ref 1.9–3.7)
Glucose, Bld: 91 mg/dL (ref 65–99)
Potassium: 5 mmol/L (ref 3.5–5.3)
Sodium: 140 mmol/L (ref 135–146)
Total Bilirubin: 0.5 mg/dL (ref 0.2–1.2)
Total Protein: 7.4 g/dL (ref 6.1–8.1)

## 2019-07-19 LAB — CBC WITH DIFFERENTIAL/PLATELET
Absolute Monocytes: 540 cells/uL (ref 200–950)
Basophils Absolute: 71 cells/uL (ref 0–200)
Basophils Relative: 1 %
Eosinophils Absolute: 192 cells/uL (ref 15–500)
Eosinophils Relative: 2.7 %
HCT: 35.4 % (ref 35.0–45.0)
Hemoglobin: 12.2 g/dL (ref 11.7–15.5)
Lymphs Abs: 3429 cells/uL (ref 850–3900)
MCH: 28.8 pg (ref 27.0–33.0)
MCHC: 34.5 g/dL (ref 32.0–36.0)
MCV: 83.5 fL (ref 80.0–100.0)
MPV: 9.4 fL (ref 7.5–12.5)
Monocytes Relative: 7.6 %
Neutro Abs: 2868 cells/uL (ref 1500–7800)
Neutrophils Relative %: 40.4 %
Platelets: 366 10*3/uL (ref 140–400)
RBC: 4.24 10*6/uL (ref 3.80–5.10)
RDW: 14.2 % (ref 11.0–15.0)
Total Lymphocyte: 48.3 %
WBC: 7.1 10*3/uL (ref 3.8–10.8)

## 2019-07-19 LAB — LIPID PANEL W/REFLEX DIRECT LDL
Cholesterol: 186 mg/dL (ref <200)
HDL: 34 mg/dL — ABNORMAL LOW (ref >=50)
LDL Cholesterol (Calc): 131 mg/dL (calc) — ABNORMAL HIGH
Non-HDL Cholesterol (Calc): 152 mg/dL (calc) — ABNORMAL HIGH (ref <130)
Total CHOL/HDL Ratio: 5.5 (calc) — ABNORMAL HIGH (ref <5.0)
Triglycerides: 99 mg/dL (ref <150)

## 2019-07-19 NOTE — Telephone Encounter (Signed)
Call pt: Kidney, liver, glucose look great. No anemia and platelets look great.   LDL is elevated but down frome 1 year ago.  HDL went down too which we like the good cholesterol as high as we can get it. At this point continuing to improve lifestyle is the best management. We can talk about that more with physical appt.

## 2019-07-25 ENCOUNTER — Ambulatory Visit (INDEPENDENT_AMBULATORY_CARE_PROVIDER_SITE_OTHER): Payer: BC Managed Care – PPO

## 2019-07-25 ENCOUNTER — Other Ambulatory Visit: Payer: Self-pay

## 2019-07-25 DIAGNOSIS — G4452 New daily persistent headache (NDPH): Secondary | ICD-10-CM

## 2019-07-25 DIAGNOSIS — R51 Headache: Secondary | ICD-10-CM | POA: Diagnosis not present

## 2019-07-25 MED ORDER — GADOBUTROL 1 MMOL/ML IV SOLN
10.0000 mL | Freq: Once | INTRAVENOUS | Status: AC | PRN
  Administered 2019-07-25: 10 mL via INTRAVENOUS

## 2019-07-26 ENCOUNTER — Encounter: Payer: BC Managed Care – PPO | Admitting: Physician Assistant

## 2019-07-26 NOTE — Progress Notes (Signed)
GREAT news. No recurrent mass. MRI looks great.

## 2019-07-27 ENCOUNTER — Ambulatory Visit (INDEPENDENT_AMBULATORY_CARE_PROVIDER_SITE_OTHER): Payer: BC Managed Care – PPO | Admitting: Physician Assistant

## 2019-07-27 ENCOUNTER — Other Ambulatory Visit: Payer: Self-pay

## 2019-07-27 ENCOUNTER — Encounter: Payer: Self-pay | Admitting: Physician Assistant

## 2019-07-27 VITALS — BP 117/85 | HR 78 | Temp 98.5°F | Ht 67.0 in | Wt 290.0 lb

## 2019-07-27 DIAGNOSIS — E559 Vitamin D deficiency, unspecified: Secondary | ICD-10-CM

## 2019-07-27 DIAGNOSIS — Z0000 Encounter for general adult medical examination without abnormal findings: Secondary | ICD-10-CM | POA: Diagnosis not present

## 2019-07-27 DIAGNOSIS — R0683 Snoring: Secondary | ICD-10-CM

## 2019-07-27 DIAGNOSIS — G44229 Chronic tension-type headache, not intractable: Secondary | ICD-10-CM

## 2019-07-27 DIAGNOSIS — G4452 New daily persistent headache (NDPH): Secondary | ICD-10-CM | POA: Diagnosis not present

## 2019-07-27 DIAGNOSIS — G478 Other sleep disorders: Secondary | ICD-10-CM

## 2019-07-27 DIAGNOSIS — Z8639 Personal history of other endocrine, nutritional and metabolic disease: Secondary | ICD-10-CM | POA: Diagnosis not present

## 2019-07-27 DIAGNOSIS — R5383 Other fatigue: Secondary | ICD-10-CM | POA: Diagnosis not present

## 2019-07-27 DIAGNOSIS — Z113 Encounter for screening for infections with a predominantly sexual mode of transmission: Secondary | ICD-10-CM

## 2019-07-27 MED ORDER — BACLOFEN 10 MG PO TABS: 10.0000 mg | ORAL_TABLET | Freq: Three times a day (TID) | ORAL | 0 refills | Status: DC

## 2019-07-27 MED ORDER — BUTALBITAL-APAP-CAFFEINE 50-325-40 MG PO TABS: 1.0000 | ORAL_TABLET | Freq: Four times a day (QID) | ORAL | 0 refills | Status: AC | PRN

## 2019-07-27 NOTE — Progress Notes (Signed)
Subjective:     Lauren Melton is a 33 y.o. female and is here for a comprehensive physical exam. The patient reports problems - pt continues to be very tired. she sleeps 10-7 but wakes up not feeling rested. she would like to lose weight. she tried phentermine and help but she has now gained weight back. she continues to have dull headache. no migraines. no vision changes. normal MRI after hx of benign brain tumor. .   Social History   Socioeconomic History  . Marital status: Single    Spouse name: Not on file  . Number of children: Not on file  . Years of education: Not on file  . Highest education level: Not on file  Occupational History  . Not on file  Social Needs  . Financial resource strain: Not on file  . Food insecurity    Worry: Not on file    Inability: Not on file  . Transportation needs    Medical: Not on file    Non-medical: Not on file  Tobacco Use  . Smoking status: Never Smoker  . Smokeless tobacco: Never Used  Substance and Sexual Activity  . Alcohol use: No  . Drug use: No  . Sexual activity: Yes  Lifestyle  . Physical activity    Days per week: Not on file    Minutes per session: Not on file  . Stress: Not on file  Relationships  . Social Herbalist on phone: Not on file    Gets together: Not on file    Attends religious service: Not on file    Active member of club or organization: Not on file    Attends meetings of clubs or organizations: Not on file    Relationship status: Not on file  . Intimate partner violence    Fear of current or ex partner: Not on file    Emotionally abused: Not on file    Physically abused: Not on file    Forced sexual activity: Not on file  Other Topics Concern  . Not on file  Social History Narrative  . Not on file   Health Maintenance  Topic Date Due  . INFLUENZA VACCINE  07/30/2019  . PAP SMEAR-Modifier  05/19/2021  . TETANUS/TDAP  09/28/2025  . HIV Screening  Completed    The following  portions of the patient's history were reviewed and updated as appropriate: allergies, current medications, past family history, past medical history, past social history, past surgical history and problem list.  Review of Systems A comprehensive review of systems was negative.   Objective:    BP 117/85   Pulse 78   Temp 98.5 F (36.9 C) (Oral)   Ht 5\' 7"  (1.702 m)   Wt 290 lb (131.5 kg)   SpO2 100%   BMI 45.42 kg/m  General appearance: alert, cooperative, appears stated age and morbidly obese Head: Normocephalic, without obvious abnormality, atraumatic Eyes: conjunctivae/corneas clear. PERRL, EOM's intact. Fundi benign. Ears: normal TM's and external ear canals both ears Nose: Nares normal. Septum midline. Mucosa normal. No drainage or sinus tenderness. Throat: lips, mucosa, and tongue normal; teeth and gums normal Neck: no adenopathy, no carotid bruit, no JVD, supple, symmetrical, trachea midline and thyroid not enlarged, symmetric, no tenderness/mass/nodules Back: symmetric, no curvature. ROM normal. No CVA tenderness. Lungs: clear to auscultation bilaterally Heart: regular rate and rhythm, S1, S2 normal, no murmur, click, rub or gallop Abdomen: soft, non-tender; bowel sounds normal; no masses,  no  organomegaly Extremities: extremities normal, atraumatic, no cyanosis or edema Pulses: 2+ and symmetric Skin: Skin color, texture, turgor normal. No rashes or lesions Lymph nodes: Cervical, supraclavicular, and axillary nodes normal. Neurologic: Alert and oriented X 3, normal strength and tone. Normal symmetric reflexes. Normal coordination and gait    Assessment:    Healthy female exam.      Plan:     [name] KitchenMarland KitchenSamathia was seen today for annual exam.  Diagnoses and all orders for this visit:  Routine physical examination -     B12 and Folate Panel -     TSH -     VITAMIN D 25 Hydroxy (Vit-D Deficiency, Fractures) -     HIV antibody (with reflex) -     RPR  Morbid obesity  (HCC) -     B12 and Folate Panel -     TSH -     VITAMIN D 25 Hydroxy (Vit-D Deficiency, Fractures) -     Home sleep test  Screen for STD (sexually transmitted disease) -     HIV antibody (with reflex) -     RPR -     C. trachomatis/N. gonorrhoeae RNA  No energy -     B12 and Folate Panel -     TSH -     VITAMIN D 25 Hydroxy (Vit-D Deficiency, Fractures) -     Home sleep test  New daily persistent headache -     B12 and Folate Panel -     TSH -     VITAMIN D 25 Hydroxy (Vit-D Deficiency, Fractures)  Vitamin D insufficiency -     VITAMIN D 25 Hydroxy (Vit-D Deficiency, Fractures)  Non-restorative sleep -     Home sleep test  Snoring -     Home sleep test  Chronic tension-type headache, not intractable -     baclofen (LIORESAL) 10 MG tablet; Take 1 tablet (10 mg total) by mouth 3 (three) times daily. -     butalbital-acetaminophen-caffeine (FIORICET) 50-325-40 MG tablet; Take 1 tablet by mouth every 6 (six) hours as needed for headache.    .. Depression screen [facility] 2/9 07/27/2019 05/19/2018 06/17/2017  Decreased Interest 0 0 0  Down, Depressed, Hopeless 0 0 0  PHQ - 2 Score 0 0 0  Altered sleeping 0 0 -  Tired, decreased energy 0 0 -  Change in appetite 0 0 -  Feeling bad or failure about yourself  0 0 -  Trouble concentrating 0 0 -  Moving slowly or fidgety/restless 0 0 -  Suicidal thoughts 0 0 -  PHQ-9 Score 0 0 -  Difficult doing work/chores Not difficult at all - -   .[name] Kitchen GAD 7 : Generalized Anxiety Score 07/27/2019 05/19/2018  Nervous, Anxious, on Edge 0 0  Control/stop worrying 0 0  Worry too much - different things 0 0  Trouble relaxing 0 0  Restless 0 0  Easily annoyed or irritable 0 0  Afraid - awful might happen 0 0  Total GAD 7 Score 0 0  Anxiety Difficulty Not difficult at all Not difficult at all   .[name] Kitchen Discussed 150 minutes of exercise a week.  Encouraged vitamin D 1000 units and Calcium 1300mg  or 4 servings of dairy a day.  Fasting labs ordered.   Pap up to date. STD testing done.  Encouraged to wear condoms.   Ordered home sleep study.   For headaches consider: chiropractor, baclofen at bedtime, Fioricet for rescue.   [name] Kitchen.Discussed low carb diet with  1500 calories and 80g of protein.  Exercising at least 150 minutes a week.  My Fitness Pal could be a Microbiologist.  Discussed medications.  Call if would like to start.  IF for 2 months.  Consider bariatric referral for surgery consult.      See After Visit Summary for Counseling Recommendations

## 2019-07-27 NOTE — Patient Instructions (Signed)
saxenda contrave qsymia   Health Maintenance, Female Adopting a healthy lifestyle and getting preventive care are important in promoting health and wellness. Ask your health care provider about:  The right schedule for you to have regular tests and exams.  Things you can do on your own to prevent diseases and keep yourself healthy. What should I know about diet, weight, and exercise? Eat a healthy diet   Eat a diet that includes plenty of vegetables, fruits, low-fat dairy products, and lean protein.  Do not eat a lot of foods that are high in solid fats, added sugars, or sodium. Maintain a healthy weight Body mass index (BMI) is used to identify weight problems. It estimates body fat based on height and weight. Your health care provider can help determine your BMI and help you achieve or maintain a healthy weight. Get regular exercise Get regular exercise. This is one of the most important things you can do for your health. Most adults should:  Exercise for at least 150 minutes each week. The exercise should increase your heart rate and make you sweat (moderate-intensity exercise).  Do strengthening exercises at least twice a week. This is in addition to the moderate-intensity exercise.  Spend less time sitting. Even light physical activity can be beneficial. Watch cholesterol and blood lipids Have your blood tested for lipids and cholesterol at 34 years of age, then have this test every 5 years. Have your cholesterol levels checked more often if:  Your lipid or cholesterol levels are high.  You are older than 34 years of age.  You are at high risk for heart disease. What should I know about cancer screening? Depending on your health history and family history, you may need to have cancer screening at various ages. This may include screening for:  Breast cancer.  Cervical cancer.  Colorectal cancer.  Skin cancer.  Lung cancer. What should I know about heart disease,  diabetes, and high blood pressure? Blood pressure and heart disease  High blood pressure causes heart disease and increases the risk of stroke. This is more likely to develop in people who have high blood pressure readings, are of African descent, or are overweight.  Have your blood pressure checked: ? Every 3-5 years if you are 13-89 years of age. ? Every year if you are 34 years old or older. Diabetes Have regular diabetes screenings. This checks your fasting blood sugar level. Have the screening done:  Once every three years after age 4 if you are at a normal weight and have a low risk for diabetes.  More often and at a younger age if you are overweight or have a high risk for diabetes. What should I know about preventing infection? Hepatitis B If you have a higher risk for hepatitis B, you should be screened for this virus. Talk with your health care provider to find out if you are at risk for hepatitis B infection. Hepatitis C Testing is recommended for:  Everyone born from [birth year] through 1966.  Anyone with known risk factors for hepatitis C. Sexually transmitted infections (STIs)  Get screened for STIs, including gonorrhea and chlamydia, if: ? You are sexually active and are younger than 34 years of age. ? You are older than 34 years of age and your health care provider tells you that you are at risk for this type of infection. ? Your sexual activity has changed since you were last screened, and you are at increased risk for chlamydia or gonorrhea. Ask  your health care provider if you are at risk.  Ask your health care provider about whether you are at high risk for HIV. Your health care provider may recommend a prescription medicine to help prevent HIV infection. If you choose to take medicine to prevent HIV, you should first get tested for HIV. You should then be tested every 3 months for as long as you are taking the medicine. Pregnancy  If you are about to stop having your  period (premenopausal) and you may become pregnant, seek counseling before you get pregnant.  Take 400 to 800 micrograms (mcg) of folic acid every day if you become pregnant.  Ask for birth control (contraception) if you want to prevent pregnancy. Osteoporosis and menopause Osteoporosis is a disease in which the bones lose minerals and strength with aging. This can result in bone fractures. If you are 65 years old or older, or if you are at risk for osteoporosis and fractures, ask your health care provider if you should:  Be screened for bone loss.  Take a calcium or vitamin D supplement to lower your risk of fractures.  Be given hormone replacement therapy (HRT) to treat symptoms of menopause. Follow these instructions at home: Lifestyle  Do not use any products that contain nicotine or tobacco, such as cigarettes, e-cigarettes, and chewing tobacco. If you need help quitting, ask your health care provider.  Do not use street drugs.  Do not share needles.  Ask your health care provider for help if you need support or information about quitting drugs. Alcohol use  Do not drink alcohol if: ? Your health care provider tells you not to drink. ? You are pregnant, may be pregnant, or are planning to become pregnant.  If you drink alcohol: ? Limit how much you use to 0-1 drink a day. ? Limit intake if you are breastfeeding.  Be aware of how much alcohol is in your drink. In the [place] one drink equals one 12 oz bottle of beer (355 mL), one 5 oz glass of wine (148 mL), or one 1 oz glass of hard liquor (44 mL). General instructions  Schedule regular health, dental, and eye exams.  Stay current with your vaccines.  Tell your health care provider if: ? You often feel depressed. ? You have ever been abused or do not feel safe at home. Summary  Adopting a healthy lifestyle and getting preventive care are important in promoting health and wellness.  Follow your health care provider's  instructions about healthy diet, exercising, and getting tested or screened for diseases.  Follow your health care provider's instructions on monitoring your cholesterol and blood pressure. This information is not intended to replace advice given to you by your health care provider. Make sure you discuss any questions you have with your health care provider. Document Released: 06/30/2011 Document Revised: 12/08/2018 Document Reviewed: 12/08/2018 Elsevier Patient Education  2020 [employer]  

## 2019-07-28 LAB — C. TRACHOMATIS/N. GONORRHOEAE RNA
C. trachomatis RNA, TMA: NOT DETECTED
N. gonorrhoeae RNA, TMA: NOT DETECTED

## 2019-07-28 LAB — RPR: RPR Ser Ql: NONREACTIVE

## 2019-07-28 LAB — B12 AND FOLATE PANEL
Folate: 7 ng/mL
Vitamin B-12: 368 pg/mL (ref 200–1100)

## 2019-07-28 LAB — HIV ANTIBODY (ROUTINE TESTING W REFLEX): HIV 1&2 Ab, 4th Generation: NONREACTIVE

## 2019-07-28 LAB — TSH: TSH: 0.86 mIU/L

## 2019-07-28 LAB — VITAMIN D 25 HYDROXY (VIT D DEFICIENCY, FRACTURES): Vit D, 25-Hydroxy: 16 ng/mL — ABNORMAL LOW (ref 30–100)

## 2019-07-28 NOTE — Progress Notes (Signed)
B12 is low normal. Consider b12 1054mcg sublingual.   Vitamin D low. Start 2000 units D2 daily with a fatty meal. It is absorbed better that way.   TSH normal.   STD negative.

## 2019-08-02 DIAGNOSIS — G44229 Chronic tension-type headache, not intractable: Secondary | ICD-10-CM | POA: Insufficient documentation

## 2019-08-02 DIAGNOSIS — G478 Other sleep disorders: Secondary | ICD-10-CM | POA: Insufficient documentation

## 2019-08-02 DIAGNOSIS — R0683 Snoring: Secondary | ICD-10-CM | POA: Insufficient documentation

## 2019-08-22 ENCOUNTER — Ambulatory Visit (INDEPENDENT_AMBULATORY_CARE_PROVIDER_SITE_OTHER): Payer: BC Managed Care – PPO | Admitting: Physician Assistant

## 2019-08-22 ENCOUNTER — Other Ambulatory Visit: Payer: Self-pay

## 2019-08-22 VITALS — Ht 67.0 in

## 2019-08-22 DIAGNOSIS — R05 Cough: Secondary | ICD-10-CM | POA: Diagnosis not present

## 2019-08-22 DIAGNOSIS — J3489 Other specified disorders of nose and nasal sinuses: Secondary | ICD-10-CM | POA: Diagnosis not present

## 2019-08-22 DIAGNOSIS — J029 Acute pharyngitis, unspecified: Secondary | ICD-10-CM | POA: Diagnosis not present

## 2019-08-22 DIAGNOSIS — R059 Cough, unspecified: Secondary | ICD-10-CM

## 2019-08-22 DIAGNOSIS — H9203 Otalgia, bilateral: Secondary | ICD-10-CM | POA: Diagnosis not present

## 2019-08-22 MED ORDER — AMOXICILLIN-POT CLAVULANATE 875-125 MG PO TABS: 1.0000 | ORAL_TABLET | Freq: Two times a day (BID) | ORAL | 0 refills | Status: DC

## 2019-08-22 NOTE — Progress Notes (Signed)
Patient ID: Lauren Melton, female   DOB: 08/31/1986, 33 y.o.   MRN: [id number] .[name] KitchenVirtual Visit via Video Note  I connected with Lauren Melton on 08/22/19 at  9:10 AM EDT by a video enabled telemedicine application and verified that I am speaking with the correct person using two identifiers.  Location: Patient: home Provider: clinic   I discussed the limitations of evaluation and management by telemedicine and the availability of in person appointments. The patient expressed understanding and agreed to proceed.  History of Present Illness: Pt is a 33 yo female who calls into the clinic with sinus pressure, cough, bilateral ear pain, ST, headache and fatigue. Symptoms have been progressively getting worse since Friday. Her son has similar symptoms and tested negative for covid on Friday. She works from home. She reports improvement with OTC alka seltzer sinus relief but comes right back. She denies any fever, chills, SOB, loss of smell or taste, GI symptoms. No known covid exposures.   .. Active Ambulatory Problems    Diagnosis Date Noted  . Cesarean delivery delivered 01/14/2016  . Essential hypertension 08/13/2016  . History of benign brain tumor 08/13/2016  . 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 No energy 08/27/2016  . Morbid obesity ([place]) 08/27/2016  . Vitamin D insufficiency 08/29/2016  . Slow transit constipation 05/26/2018  . Acute stress reaction 09/09/2018  . New daily persistent headache 07/27/2019  . Chronic tension-type headache, not intractable 08/02/2019  . Snoring 08/02/2019  . Non-restorative sleep 08/02/2019   Resolved Ambulatory Problems    Diagnosis Date Noted  . No Resolved Ambulatory Problems   Past Medical History:  Diagnosis Date  . HSV-2 seropositive   . Hypertension    Reviewed med, allergy, problem list.     Observations/Objective: No acute distress. No labored breathing.  Appears tired and sounds congested.  Normal mood.   .. Today's  Vitals   08/22/19 0855  Height: 5\' 7"  (1.702 m)   Body mass index is 45.42 kg/m.   Assessment and Plan: [name] KitchenMarland KitchenSamathia was seen today for sinusitis.  Diagnoses and all orders for this visit:  Sinus pressure -     amoxicillin-clavulanate (AUGMENTIN) 875-125 MG tablet; Take 1 tablet by mouth 2 (two) times daily.  Ear pain, bilateral -     amoxicillin-clavulanate (AUGMENTIN) 875-125 MG tablet; Take 1 tablet by mouth 2 (two) times daily.  Sore throat  Cough   Needs COVID testing and to self isolate until results. [facility] will [facility]. Symptoms sound more like sinusitis. Hold augmentin for another 1-2 days and see if symptoms improve. Continue OTC. If no improvement start augmentin. Rest and hydrate.    Follow Up Instructions:    I discussed the assessment and treatment plan with the patient. The patient was provided an opportunity to ask questions and all were answered. The patient agreed with the plan and demonstrated an understanding of the instructions.   The patient was advised to call back or seek an in-person evaluation if the symptoms worsen or if the condition fails to improve as anticipated.    [name], PA-C

## 2019-08-23 ENCOUNTER — Encounter: Payer: Self-pay | Admitting: Physician Assistant

## 2019-08-23 DIAGNOSIS — Z20828 Contact with and (suspected) exposure to other viral communicable diseases: Secondary | ICD-10-CM | POA: Diagnosis not present

## 2019-09-13 DIAGNOSIS — Z20828 Contact with and (suspected) exposure to other viral communicable diseases: Secondary | ICD-10-CM | POA: Diagnosis not present

## 2019-10-06 DIAGNOSIS — Z6841 Body Mass Index (BMI) 40.0 and over, adult: Secondary | ICD-10-CM | POA: Diagnosis not present

## 2019-10-06 DIAGNOSIS — Z113 Encounter for screening for infections with a predominantly sexual mode of transmission: Secondary | ICD-10-CM | POA: Diagnosis not present

## 2019-10-06 DIAGNOSIS — Z01419 Encounter for gynecological examination (general) (routine) without abnormal findings: Secondary | ICD-10-CM | POA: Diagnosis not present

## 2019-10-06 DIAGNOSIS — N760 Acute vaginitis: Secondary | ICD-10-CM | POA: Diagnosis not present

## 2019-10-24 DIAGNOSIS — Z20828 Contact with and (suspected) exposure to other viral communicable diseases: Secondary | ICD-10-CM | POA: Diagnosis not present

## 2019-11-01 ENCOUNTER — Encounter: Payer: Self-pay | Admitting: Physician Assistant

## 2019-11-01 NOTE — Telephone Encounter (Signed)
Also received vm from [place] at [facility] asking for different codes.  These are diagnosis codes used:   Routine physical examination [Z00.00] - Primary     Morbid obesity ([place]) [E66.01]     No energy [R53.83]     New daily persistent headache [G44.52]      Please advise if different diagnosis should be included?

## 2019-11-01 NOTE — Telephone Encounter (Signed)
Can you get me a list over codes that will approve b12/folate? I will look it over tomorrow.

## 2019-11-02 ENCOUNTER — Telehealth: Payer: Self-pay

## 2019-11-02 NOTE — Telephone Encounter (Signed)
Given

## 2019-11-02 NOTE — Telephone Encounter (Signed)
Sent code to [place] with [facility] through email.

## 2019-11-02 NOTE — Telephone Encounter (Signed)
Use Z86.39

## 2019-11-02 NOTE — Telephone Encounter (Signed)
I received an email from [place] with [facility]. They are wanting [place] to add more codes to [facility] bill to pay for the B12 and Folate.   See email -   I had a patient and her Insurance company call me yesterday about a lab bill she is receiving.  She said it was ordered at your office and is asking I reach out to you. Her Insurance is telling her the Dx code for the Folic Acid and Vit 123456 is not what they require for payment.  Will you see if you have any other Dx we can refile with? This is what was submitted: [place], [id number], [id number],  [id number], [id number], [id number]   If there are no changes , I will call the patient back and let her know if you  would like me to. 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

## 2019-11-02 NOTE — Telephone Encounter (Signed)
See telephone note.

## 2020-01-31 ENCOUNTER — Encounter: Payer: Self-pay | Admitting: Physician Assistant

## 2020-02-02 ENCOUNTER — Other Ambulatory Visit: Payer: Self-pay | Admitting: Radiology

## 2020-02-02 DIAGNOSIS — N6320 Unspecified lump in the left breast, unspecified quadrant: Secondary | ICD-10-CM | POA: Diagnosis not present

## 2020-02-11 DIAGNOSIS — Z20828 Contact with and (suspected) exposure to other viral communicable diseases: Secondary | ICD-10-CM | POA: Diagnosis not present

## 2020-02-11 DIAGNOSIS — Z03818 Encounter for observation for suspected exposure to other biological agents ruled out: Secondary | ICD-10-CM | POA: Diagnosis not present

## 2020-02-15 ENCOUNTER — Other Ambulatory Visit: Payer: BC Managed Care – PPO

## 2020-02-18 DIAGNOSIS — Z20822 Contact with and (suspected) exposure to covid-19: Secondary | ICD-10-CM | POA: Diagnosis not present

## 2020-02-18 DIAGNOSIS — R439 Unspecified disturbances of smell and taste: Secondary | ICD-10-CM | POA: Diagnosis not present

## 2020-02-29 ENCOUNTER — Encounter: Payer: Self-pay | Admitting: Physician Assistant

## 2020-03-02 ENCOUNTER — Encounter: Payer: Self-pay | Admitting: Physician Assistant

## 2020-03-05 ENCOUNTER — Ambulatory Visit
Admission: RE | Admit: 2020-03-05 | Discharge: 2020-03-05 | Disposition: A | Discharge status: Home / Self Care | Payer: BC Managed Care – PPO | Source: Ambulatory Visit | Attending: Radiology | Admitting: Radiology

## 2020-03-05 ENCOUNTER — Ambulatory Visit
Admission: RE | Admit: 2020-03-05 | Discharge: 2020-03-05 | Disposition: A | Discharge status: Home / Self Care | Payer: Self-pay | Source: Ambulatory Visit | Attending: Radiology | Admitting: Radiology

## 2020-03-05 ENCOUNTER — Other Ambulatory Visit: Payer: Self-pay

## 2020-03-05 DIAGNOSIS — N6320 Unspecified lump in the left breast, unspecified quadrant: Secondary | ICD-10-CM

## 2020-03-05 DIAGNOSIS — R928 Other abnormal and inconclusive findings on diagnostic imaging of breast: Secondary | ICD-10-CM | POA: Diagnosis not present

## 2020-03-05 DIAGNOSIS — N6489 Other specified disorders of breast: Secondary | ICD-10-CM | POA: Diagnosis not present

## 2020-03-05 NOTE — Telephone Encounter (Signed)
Forwarding to provider for review.

## 2020-03-07 ENCOUNTER — Encounter: Payer: Self-pay | Admitting: Physician Assistant

## 2020-03-07 NOTE — Telephone Encounter (Signed)
R68.89

## 2020-03-07 NOTE — Telephone Encounter (Signed)
Added

## 2020-03-13 ENCOUNTER — Other Ambulatory Visit: Payer: BC Managed Care – PPO

## 2020-04-05 DIAGNOSIS — Z03818 Encounter for observation for suspected exposure to other biological agents ruled out: Secondary | ICD-10-CM | POA: Diagnosis not present

## 2020-04-05 DIAGNOSIS — J301 Allergic rhinitis due to pollen: Secondary | ICD-10-CM | POA: Diagnosis not present

## 2020-04-05 DIAGNOSIS — Z20828 Contact with and (suspected) exposure to other viral communicable diseases: Secondary | ICD-10-CM | POA: Diagnosis not present

## 2020-04-10 ENCOUNTER — Ambulatory Visit (INDEPENDENT_AMBULATORY_CARE_PROVIDER_SITE_OTHER): Payer: BC Managed Care – PPO | Admitting: Physician Assistant

## 2020-04-10 ENCOUNTER — Encounter: Payer: Self-pay | Admitting: Physician Assistant

## 2020-04-10 VITALS — BP 154/99 | HR 69 | Temp 98.8°F | Ht 67.0 in | Wt 290.0 lb

## 2020-04-10 DIAGNOSIS — M545 Low back pain, unspecified: Secondary | ICD-10-CM

## 2020-04-10 DIAGNOSIS — N949 Unspecified condition associated with female genital organs and menstrual cycle: Secondary | ICD-10-CM

## 2020-04-10 DIAGNOSIS — F4321 Adjustment disorder with depressed mood: Secondary | ICD-10-CM

## 2020-04-10 DIAGNOSIS — I10 Essential (primary) hypertension: Secondary | ICD-10-CM

## 2020-04-10 DIAGNOSIS — N898 Other specified noninflammatory disorders of vagina: Secondary | ICD-10-CM

## 2020-04-10 DIAGNOSIS — J018 Other acute sinusitis: Secondary | ICD-10-CM

## 2020-04-10 DIAGNOSIS — R102 Pelvic and perineal pain: Secondary | ICD-10-CM | POA: Diagnosis not present

## 2020-04-10 LAB — POCT URINALYSIS DIP (CLINITEK)
Bilirubin, UA: NEGATIVE
Blood, UA: NEGATIVE
Glucose, UA: NEGATIVE mg/dL
Ketones, POC UA: NEGATIVE mg/dL
Leukocytes, UA: NEGATIVE
Nitrite, UA: NEGATIVE
POC PROTEIN,UA: NEGATIVE
Spec Grav, UA: 1.02 (ref 1.010–1.025)
Urobilinogen, UA: 0.2 E.U./dL
pH, UA: 6 (ref 5.0–8.0)

## 2020-04-10 MED ORDER — FLUTICASONE PROPIONATE 50 MCG/ACT NA SUSP: 2.0000 | Freq: Every day | NASAL | 6 refills | Status: DC

## 2020-04-10 MED ORDER — BISOPROLOL-HYDROCHLOROTHIAZIDE 5-6.25 MG PO TABS: 1.0000 | ORAL_TABLET | Freq: Every day | ORAL | 0 refills | Status: DC

## 2020-04-10 MED ORDER — METRONIDAZOLE 500 MG PO TABS: 500.0000 mg | ORAL_TABLET | Freq: Two times a day (BID) | ORAL | 0 refills | Status: DC

## 2020-04-10 MED ORDER — AMOXICILLIN-POT CLAVULANATE 875-125 MG PO TABS: 1.0000 | ORAL_TABLET | Freq: Two times a day (BID) | ORAL | 0 refills | Status: AC

## 2020-04-10 NOTE — Progress Notes (Signed)
Subjective:    Patient ID: Lauren Melton, female    DOB: 21-Aug-1986, 34 y.o.   MRN: [id number]  HPI  Patient is a 34 year old obese female with history of hypertension and bacterial vaginosis who presents to the clinic with some vaginal discomfort and slight discharge.  She also is having some low back pain.  Her mother recently passed away from organ failure after long battle with Covid complications.  Patient is very emotional today.  She feels very alone as well.  She does have her son who she is actively taking care of.  She continues to work.  Covid has made it hard to have any type of grief counseling.  Patient does admit to having intercourse in the last few weeks with her son's father. She is having some vaginal discomfort and some acute low back pain.  She denies any pain with urination.  She does have a history of bacterial vaginosis and would like to rule out STDs.  No fever, chills, nausea or vomiting.  [facility].  She is having some sinus symptoms.  They gave her Zyrtec.  That does not seem to be making a difference.  She continues to feel sinus pressure and blowing out nasal discharge.   .. Active Ambulatory Problems    Diagnosis Date Noted  . Cesarean delivery delivered 01/14/2016  . Essential hypertension 08/13/2016  . History of benign brain tumor 08/13/2016  . 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 No energy 08/27/2016  . Morbid obesity ([place]) 08/27/2016  . Vitamin D insufficiency 08/29/2016  . Slow transit constipation 05/26/2018  . Acute stress reaction 09/09/2018  . New daily persistent headache 07/27/2019  . Chronic tension-type headache, not intractable 08/02/2019  . Snoring 08/02/2019  . Non-restorative sleep 08/02/2019  . Grief reaction 04/17/2020  . Acute bilateral low back pain without sciatica 04/17/2020   Resolved Ambulatory Problems    Diagnosis Date Noted  . No Resolved Ambulatory Problems   Past Medical History:   Diagnosis Date  . HSV-2 seropositive   . Hypertension     Review of Systems See HPI.     Objective:   Physical Exam Vitals reviewed.  Constitutional:      Appearance: Normal appearance. She is obese.  HENT:     Head: Normocephalic.  Cardiovascular:     Rate and Rhythm: Normal rate and regular rhythm.     Pulses: Normal pulses.  Pulmonary:     Effort: Pulmonary effort is normal.     Breath sounds: Normal breath sounds.  Abdominal:     General: Bowel sounds are normal. There is no distension.     Palpations: Abdomen is soft.     Tenderness: There is no abdominal tenderness. There is no right CVA tenderness, left CVA tenderness or guarding.  Genitourinary:    General: Normal vulva.     Vagina: Vaginal discharge present.     Comments: Clear to white discharge with no odor.  Neurological:     General: No focal deficit present.     Mental Status: She is alert and oriented to person, place, and time.  Psychiatric:     Comments: Tearful.           Assessment & Plan:  [name] KitchenMarland KitchenSamathia was seen today for vaginal pain and ear pain.  Diagnoses and all orders for this visit:  Vaginal discomfort -     POCT URINALYSIS DIP (CLINITEK) -     Urine Culture -  metroNIDAZOLE (FLAGYL) 500 MG tablet; Take 1 tablet (500 mg total) by mouth 2 (two) times daily. For 7 days. -     SureSwab, Vaginosis/Vaginitis Plus  Acute bilateral low back pain without sciatica -     POCT URINALYSIS DIP (CLINITEK) -     Urine Culture -     SureSwab, Vaginosis/Vaginitis Plus  Grief reaction  Essential hypertension -     bisoprolol-hydrochlorothiazide (ZIAC) 5-6.25 MG tablet; Take 1 tablet by mouth daily.  Acute non-recurrent sinusitis of other sinus -     fluticasone (FLONASE) 50 MCG/ACT nasal spray; Place 2 sprays into both nostrils daily. -     amoxicillin-clavulanate (AUGMENTIN) 875-125 MG tablet; Take 1 tablet by mouth 2 (two) times daily for 10 days.  Vaginal discharge -     metroNIDAZOLE  (FLAGYL) 500 MG tablet; Take 1 tablet (500 mg total) by mouth 2 (two) times daily. For 7 days.    Results for orders placed or performed in visit on 04/10/20  Urine Culture   Specimen: Urine  Result Value Ref Range   MICRO NUMBER: [id number]    SPECIMEN QUALITY: Adequate    Sample Source NOT GIVEN    STATUS: FINAL    ISOLATE 1:      Growth of mixed flora was isolated, suggesting probable contamination. No further testing will be performed. If clinically indicated, recollection using a method to minimize contamination, with prompt transfer to Urine Culture Transport Tube, is  recommended.   SureSwab, Vaginosis/Vaginitis Plus   Specimen: Vaginal Fluid  Result Value Ref Range   C. trachomatis RNA, TMA NOT DETECTED    N. gonorrhoeae RNA, TMA NOT DETECTED    LACTOBACILLUS SPECIES 6.5 Log (cells/mL)   Atopobium vaginae NOT DETECTED Log (cells/mL)   MEGASPHAERA SPECIES NOT DETECTED Log (cells/mL)   Gardnerella vaginalis <4.7 Log (cells/mL)   BV CATEGORY NOT SUPPORTIVE    Trichomonas vaginalis RNA NOT DETECTED    C. albicans, DNA NOT DETECTED    C. glabrata, DNA NOT DETECTED    C. tropicalis, DNA NOT DETECTED    C. parapsilosis, DNA NOT DETECTED   POCT URINALYSIS DIP (CLINITEK)  Result Value Ref Range   Color, UA yellow yellow   Clarity, UA clear clear   Glucose, UA negative negative mg/dL   Bilirubin, UA negative negative   Ketones, POC UA negative negative mg/dL   Spec Grav, UA 1.020 1.010 - 1.025   Blood, UA negative negative   pH, UA 6.0 5.0 - 8.0   POC PROTEIN,UA negative negative, trace   Urobilinogen, UA 0.2 0.2 or 1.0 E.U./dL   Nitrite, UA Negative Negative   Leukocytes, UA Negative Negative   UA was negative in office.  We did send for culture and was negative as well.  Sure swab sent for yeast, BV, STD testing.  Due to history of BV went ahead and treated with metronidazole. If back pain persist come back in for more evaluation.   Discussed processing grief with  patient.  She does not want any intervention at this time.  Follow-up as needed.  Blood pressure was elevated today.  She had been off her medication.  We decided to go back on blood pressure medication.  Sent Ziac 5/6.25 mg daily.  Recheck in 2 months.  Likely she has some allergies that induced a sinus infection.  Augmentin and Flonase sent to the pharmacy.  Follow-up as needed or if symptoms persist.

## 2020-04-10 NOTE — Patient Instructions (Signed)

## 2020-04-11 LAB — URINE CULTURE
MICRO NUMBER:: 10358702
SPECIMEN QUALITY:: ADEQUATE

## 2020-04-12 NOTE — Progress Notes (Signed)
Urine culture did not show any bacterial infection.

## 2020-04-14 LAB — SURESWAB, VAGINOSIS/VAGINITIS PLUS
Atopobium vaginae: NOT DETECTED Log (cells/mL)
C. albicans, DNA: NOT DETECTED
C. glabrata, DNA: NOT DETECTED
C. parapsilosis, DNA: NOT DETECTED
C. trachomatis RNA, TMA: NOT DETECTED
C. tropicalis, DNA: NOT DETECTED
Gardnerella vaginalis: <4.7 Log (cells/mL)
LACTOBACILLUS SPECIES: 6.5 Log (cells/mL)
MEGASPHAERA SPECIES: NOT DETECTED Log (cells/mL)
N. gonorrhoeae RNA, TMA: NOT DETECTED
Trichomonas vaginalis RNA: NOT DETECTED

## 2020-04-16 ENCOUNTER — Encounter: Payer: Self-pay | Admitting: Physician Assistant

## 2020-04-16 NOTE — Progress Notes (Signed)
Sam,   Negative for GC/Chlamydia. Negative yeast or BV. Great news. Are you having any more vaginal symptoms?

## 2020-04-17 ENCOUNTER — Encounter: Payer: Self-pay | Admitting: Physician Assistant

## 2020-04-17 DIAGNOSIS — F4321 Adjustment disorder with depressed mood: Secondary | ICD-10-CM | POA: Insufficient documentation

## 2020-04-17 DIAGNOSIS — M545 Low back pain, unspecified: Secondary | ICD-10-CM | POA: Insufficient documentation

## 2020-04-19 ENCOUNTER — Encounter: Payer: Self-pay | Admitting: Physician Assistant

## 2020-04-19 MED ORDER — FLUCONAZOLE 150 MG PO TABS: 150.0000 mg | ORAL_TABLET | Freq: Once | ORAL | 0 refills | Status: AC

## 2020-05-07 DIAGNOSIS — R1013 Epigastric pain: Secondary | ICD-10-CM | POA: Diagnosis not present

## 2020-05-07 DIAGNOSIS — E785 Hyperlipidemia, unspecified: Secondary | ICD-10-CM | POA: Diagnosis not present

## 2020-05-07 DIAGNOSIS — R110 Nausea: Secondary | ICD-10-CM | POA: Diagnosis not present

## 2020-05-07 DIAGNOSIS — K8050 Calculus of bile duct without cholangitis or cholecystitis without obstruction: Secondary | ICD-10-CM | POA: Diagnosis not present

## 2020-05-07 DIAGNOSIS — I10 Essential (primary) hypertension: Secondary | ICD-10-CM | POA: Diagnosis not present

## 2020-05-07 DIAGNOSIS — Z79899 Other long term (current) drug therapy: Secondary | ICD-10-CM | POA: Diagnosis not present

## 2020-05-08 DIAGNOSIS — R1011 Right upper quadrant pain: Secondary | ICD-10-CM | POA: Diagnosis not present

## 2020-05-08 DIAGNOSIS — K829 Disease of gallbladder, unspecified: Secondary | ICD-10-CM | POA: Diagnosis not present

## 2020-05-29 IMAGING — MG DIGITAL DIAGNOSTIC BILAT W/ TOMO W/ CAD
6 of 10 series · 6 of 30 positions shown · non-contrast
Comparison: None.

CLINICAL DATA: 34-year-old presenting with a possible palpable lump
in the INNER periareolar LEFT breast which she initially noticed
approximately 1 month ago.This is the patient's initial baseline
mammogram.

Family history of breast cancer in a maternal aunt at age 50 and a
maternal cousin at age 40.
EXAM:
DIGITAL DIAGNOSTIC BILATERAL MAMMOGRAM WITH CAD AND TOMO
ULTRASOUND LEFT BREAST

[L MLO synth-2D]
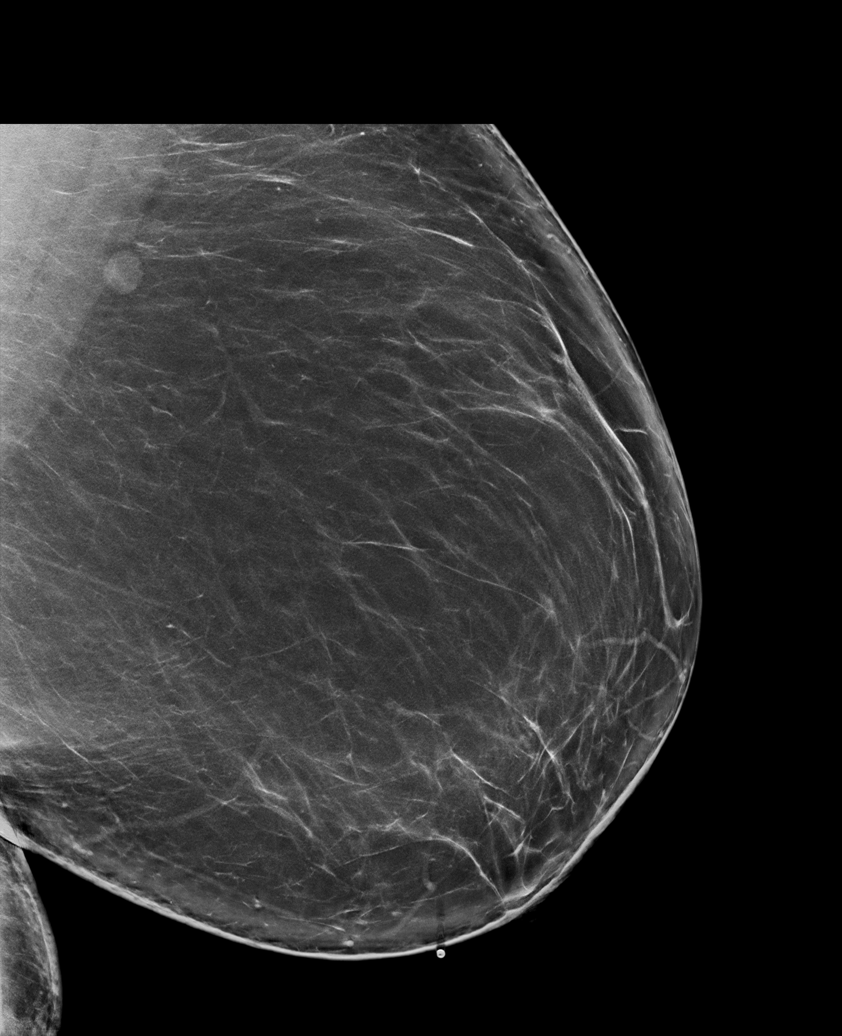

[L TAN synth-2D]
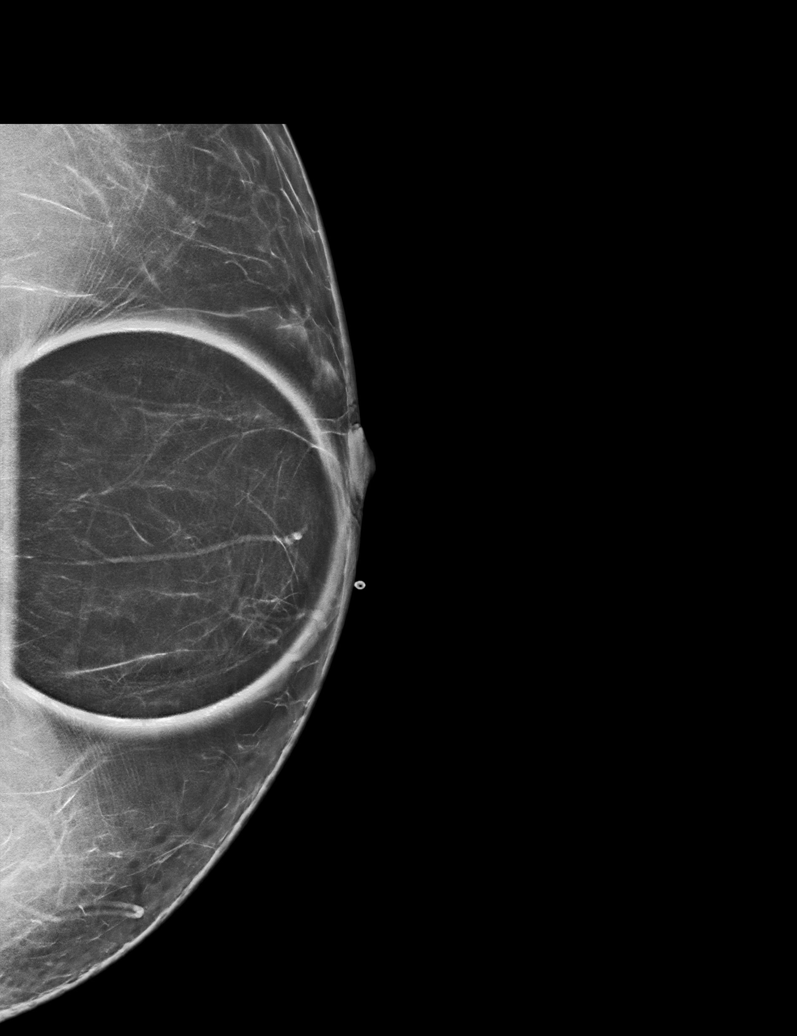

[R MLO synth-2D]
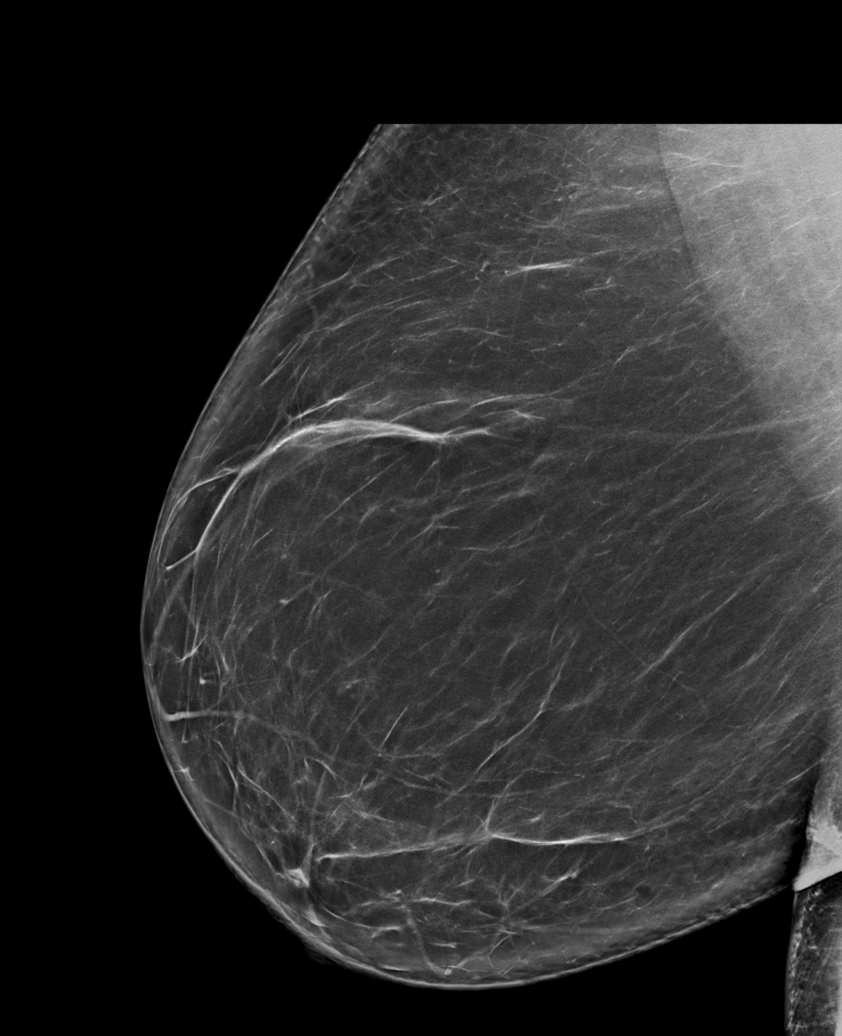

[L CC synth-2D]
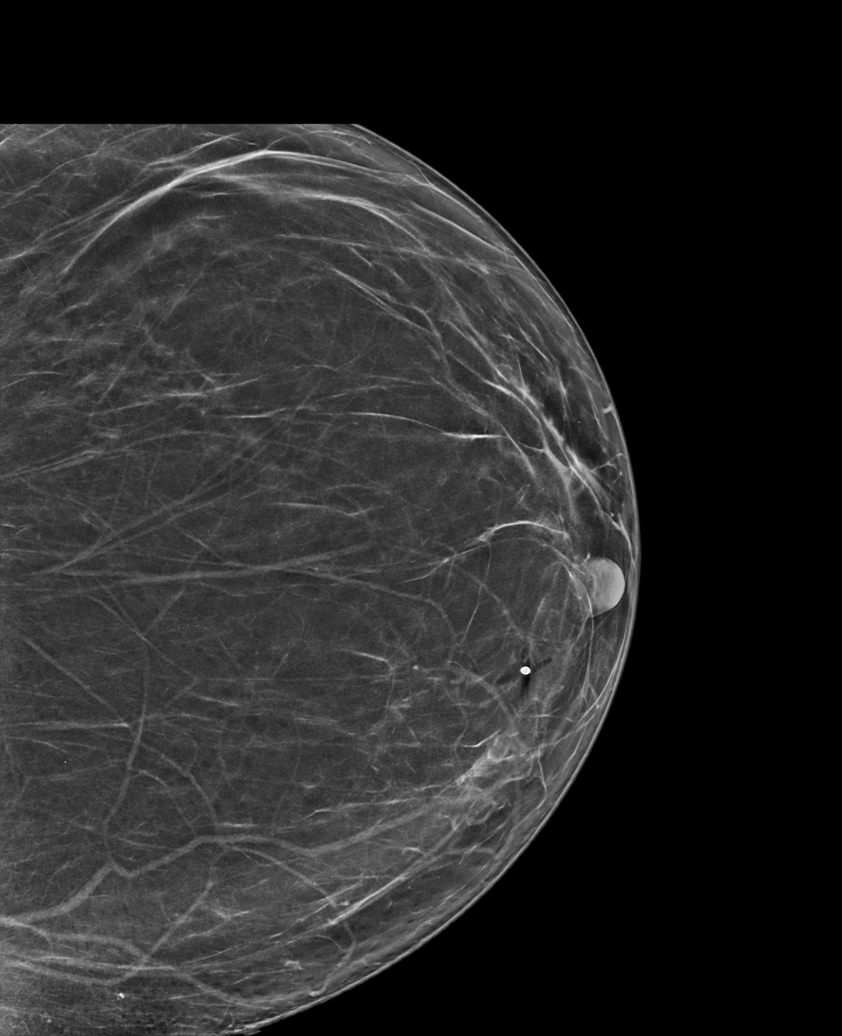

[R CC synth-2D]
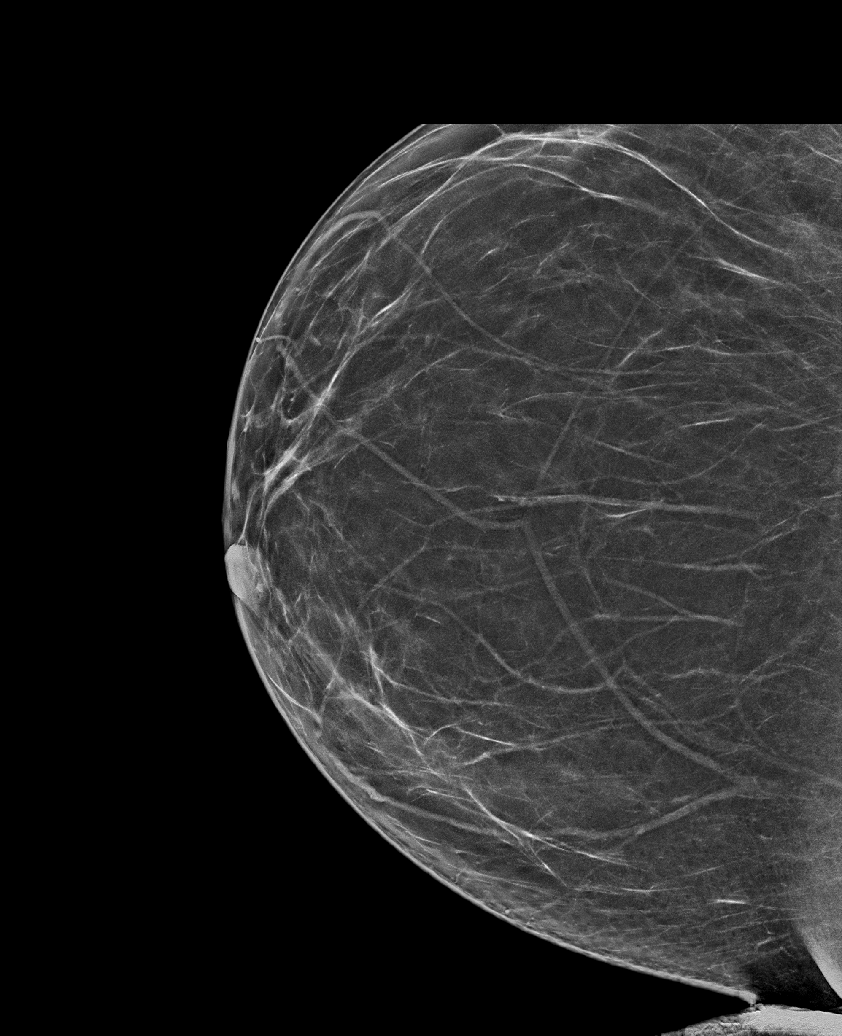

[L CC tomo · tomo slice 41/82.0]
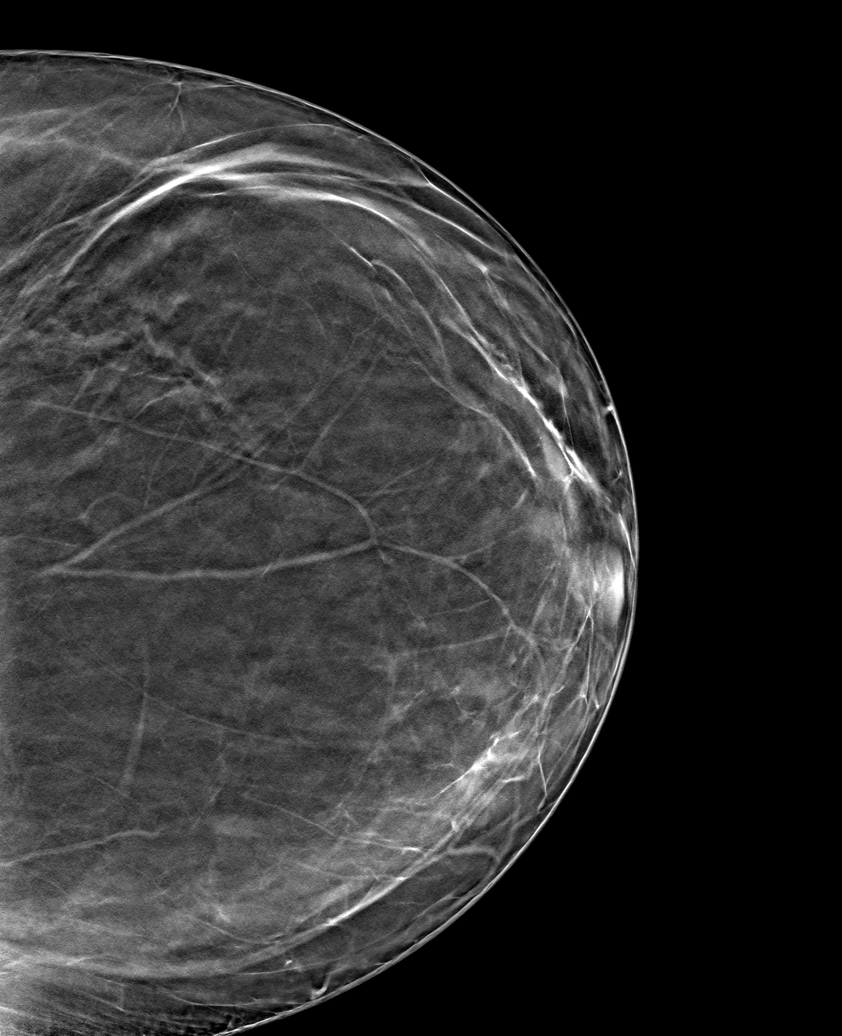

[6 of 30 positions shown; findings below may reference images not displayed]

ACR Breast Density Category b: There are scattered areas of
fibroglandular density.
FINDINGS: Tomosynthesis and synthesized full field CC and MLO views of both
breasts were obtained. Tomosynthesis and synthesized spot
compression tangential view of the area of concern in the LEFT
breast was also obtained.

No mammographic abnormality in the area of palpable concern the
INNER periareolar LEFT breast.

No findings suspicious for malignancy in either breast.

Mammographic images were processed with CAD.

On correlative physical exam, there is a vague palpable ridge of
tissue in the LOWER INNER subareolar LEFT breast in the area of
patient concern, though I do not palpate a discrete mass.

Targeted LEFT breast ultrasound is performed, showing normal
fibrofatty and scattered fibroglandular tissue at the 7 o'clock
subareolar location in the area of palpable concern. No cyst, solid
mass or abnormal acoustic shadowing is identified.
IMPRESSION: 1. No mammographic or sonographic evidence of malignancy involving
the LEFT breast.
2. No mammographic evidence of malignancy involving RIGHT breast.

RECOMMENDATION:
Screening mammogram at age 40 unless there are persistent or
subsequent clinical concerns. (Code:3U-B-KVI)

I have discussed the findings and recommendations with the patient.
If applicable, a reminder letter will be sent to the patient
regarding the next appointment.

BI-RADS CATEGORY  1: Negative.

## 2020-05-29 IMAGING — US US BREAST*L* LIMITED INC AXILLA
1 series · 2 of 2 positions shown · non-contrast
Comparison: None.

CLINICAL DATA: 34-year-old presenting with a possible palpable lump
in the INNER periareolar LEFT breast which she initially noticed
approximately 1 month ago.This is the patient's initial baseline
mammogram.

Family history of breast cancer in a maternal aunt at age 50 and a
maternal cousin at age 40.
EXAM:
DIGITAL DIAGNOSTIC BILATERAL MAMMOGRAM WITH CAD AND TOMO
ULTRASOUND LEFT BREAST

[Series 1: us breast*left* limited inc axilla · 0.07mm/px · 2 of 2 slices shown]
[im 1/2]
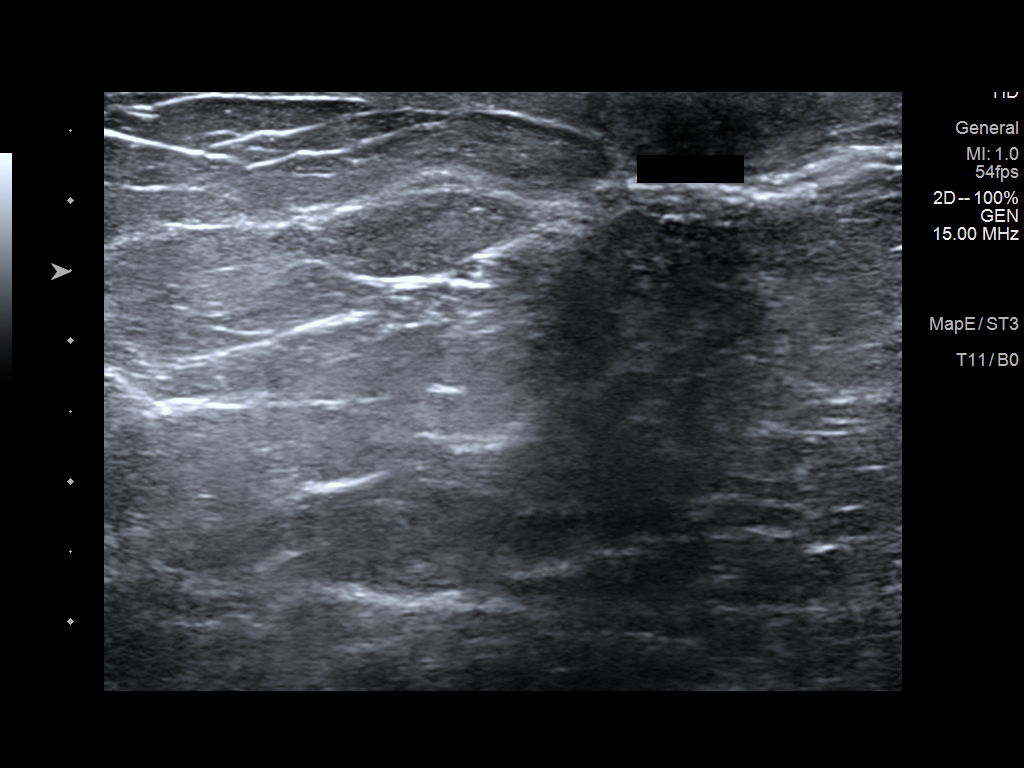
[im 2/2]
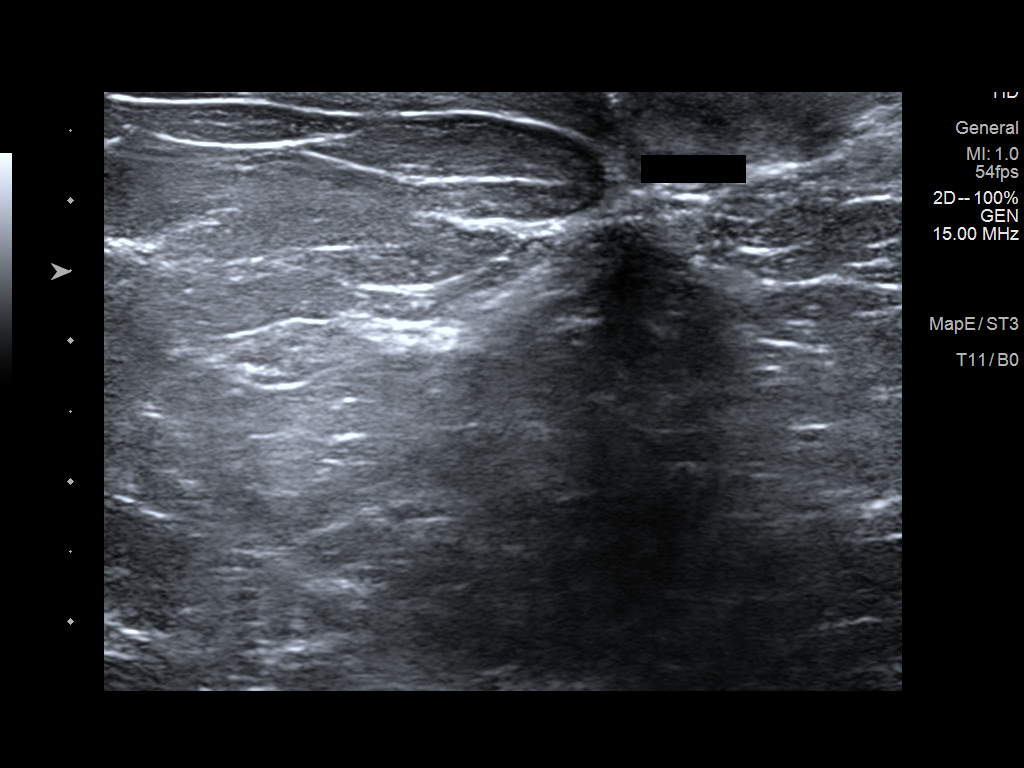

[2 of 2 positions shown; findings below may reference images not displayed]

ACR Breast Density Category b: There are scattered areas of
fibroglandular density.
FINDINGS: Tomosynthesis and synthesized full field CC and MLO views of both
breasts were obtained. Tomosynthesis and synthesized spot
compression tangential view of the area of concern in the LEFT
breast was also obtained.

No mammographic abnormality in the area of palpable concern the
INNER periareolar LEFT breast.

No findings suspicious for malignancy in either breast.

Mammographic images were processed with CAD.

On correlative physical exam, there is a vague palpable ridge of
tissue in the LOWER INNER subareolar LEFT breast in the area of
patient concern, though I do not palpate a discrete mass.

Targeted LEFT breast ultrasound is performed, showing normal
fibrofatty and scattered fibroglandular tissue at the 7 o'clock
subareolar location in the area of palpable concern. No cyst, solid
mass or abnormal acoustic shadowing is identified.
IMPRESSION: 1. No mammographic or sonographic evidence of malignancy involving
the LEFT breast.
2. No mammographic evidence of malignancy involving RIGHT breast.

RECOMMENDATION:
Screening mammogram at age 39 unless there are persistent or
subsequent clinical concerns. (Code:3U-B-KVI)

I have discussed the findings and recommendations with the patient.
If applicable, a reminder letter will be sent to the patient
regarding the next appointment.

BI-RADS CATEGORY  1: Negative.

## 2020-06-28 ENCOUNTER — Encounter: Payer: Self-pay | Admitting: Physician Assistant

## 2020-06-28 ENCOUNTER — Other Ambulatory Visit: Payer: Self-pay | Admitting: Physician Assistant

## 2020-06-28 DIAGNOSIS — N949 Unspecified condition associated with female genital organs and menstrual cycle: Secondary | ICD-10-CM

## 2020-06-28 DIAGNOSIS — N898 Other specified noninflammatory disorders of vagina: Secondary | ICD-10-CM

## 2020-06-29 ENCOUNTER — Ambulatory Visit: Payer: BC Managed Care – PPO | Admitting: Physician Assistant

## 2020-06-29 MED ORDER — METRONIDAZOLE 500 MG PO TABS: 500.0000 mg | ORAL_TABLET | Freq: Two times a day (BID) | ORAL | 0 refills | Status: DC

## 2020-07-22 DIAGNOSIS — Z20822 Contact with and (suspected) exposure to covid-19: Secondary | ICD-10-CM | POA: Diagnosis not present

## 2020-07-22 DIAGNOSIS — Z03818 Encounter for observation for suspected exposure to other biological agents ruled out: Secondary | ICD-10-CM | POA: Diagnosis not present

## 2020-07-26 ENCOUNTER — Other Ambulatory Visit: Payer: Self-pay

## 2020-07-26 ENCOUNTER — Encounter: Payer: Self-pay | Admitting: Family Medicine

## 2020-07-26 ENCOUNTER — Ambulatory Visit (INDEPENDENT_AMBULATORY_CARE_PROVIDER_SITE_OTHER): Payer: BC Managed Care – PPO | Admitting: Family Medicine

## 2020-07-26 VITALS — BP 131/87 | HR 76 | Temp 98.1°F | Wt 299.1 lb

## 2020-07-26 DIAGNOSIS — J029 Acute pharyngitis, unspecified: Secondary | ICD-10-CM | POA: Diagnosis not present

## 2020-07-26 DIAGNOSIS — G44229 Chronic tension-type headache, not intractable: Secondary | ICD-10-CM

## 2020-07-26 LAB — POCT RAPID STREP A (OFFICE): Rapid Strep A Screen: NEGATIVE

## 2020-07-26 MED ORDER — CYCLOBENZAPRINE HCL 10 MG PO TABS: 10.0000 mg | ORAL_TABLET | Freq: Three times a day (TID) | ORAL | 0 refills | Status: DC | PRN

## 2020-07-26 NOTE — Patient Instructions (Signed)
Pharyngitis  Pharyngitis is a sore throat (pharynx). This is when there is redness, pain, and swelling in your throat. Most of the time, this condition gets better on its own. In some cases, you may need medicine. Follow these instructions at home:  Take over-the-counter and prescription medicines only as told by your doctor. ? If you were prescribed an antibiotic medicine, take it as told by your doctor. Do not stop taking the antibiotic even if you start to feel better. ? Do not give children aspirin. Aspirin has been linked to Reye syndrome.  Drink enough water and fluids to keep your pee (urine) clear or pale yellow.  Get a lot of rest.  Rinse your mouth (gargle) with a salt-water mixture 3-4 times a day or as needed. To make a salt-water mixture, completely dissolve -1 tsp of salt in 1 cup of warm water.  If your doctor approves, you may use throat lozenges or sprays to soothe your throat. Contact a doctor if:  You have large, tender lumps in your neck.  You have a rash.  You cough up green, yellow-brown, or bloody spit. Get help right away if:  You have a stiff neck.  You drool or cannot swallow liquids.  You cannot drink or take medicines without throwing up.  You have very bad pain that does not go away with medicine.  You have problems breathing, and it is not from a stuffy nose.  You have new pain and swelling in your knees, ankles, wrists, or elbows. Summary  Pharyngitis is a sore throat (pharynx). This is when there is redness, pain, and swelling in your throat.  If you were prescribed an antibiotic medicine, take it as told by your doctor. Do not stop taking the antibiotic even if you start to feel better.  Most of the time, pharyngitis gets better on its own. Sometimes, you may need medicine. This information is not intended to replace advice given to you by your health care provider. Make sure you discuss any questions you have with your health care  provider. Document Revised: 11/27/2017 Document Reviewed: 01/20/2017 Elsevier Patient Education  [employer].    Neck Exercises Ask your health care provider which exercises are safe for you. Do exercises exactly as told by your health care provider and adjust them as directed. It is normal to feel mild stretching, pulling, tightness, or discomfort as you do these exercises. Stop right away if you feel sudden pain or your pain gets worse. Do not begin these exercises until told by your health care provider. Neck exercises can be important for many reasons. They can improve strength and maintain flexibility in your neck, which will help your upper back and prevent neck pain. Stretching exercises Rotation neck stretching  1. Sit in a chair or stand up. 2. Place your feet flat on the floor, shoulder width apart. 3. Slowly turn your head (rotate) to the right until a slight stretch is felt. Turn it all the way to the right so you can look over your right shoulder. Do not tilt or tip your head. 4. Hold this position for 10-30 seconds. 5. Slowly turn your head (rotate) to the left until a slight stretch is felt. Turn it all the way to the left so you can look over your left shoulder. Do not tilt or tip your head. 6. Hold this position for 10-30 seconds. Repeat __________ times. Complete this exercise __________ times a day. Neck retraction 1. Sit in a sturdy  chair or stand up. 2. Look straight ahead. Do not bend your neck. 3. Use your fingers to push your chin backward (retraction). Do not bend your neck for this movement. Continue to face straight ahead. If you are doing the exercise properly, you will feel a slight sensation in your throat and a stretch at the back of your neck. 4. Hold the stretch for 1-2 seconds. Repeat __________ times. Complete this exercise __________ times a day. Strengthening exercises Neck press 1. Lie on your back on a firm bed or on the floor with a pillow  under your head. 2. Use your neck muscles to push your head down on the pillow and straighten your spine. 3. Hold the position as well as you can. Keep your head facing up (in a neutral position) and your chin tucked. 4. Slowly count to 5 while holding this position. Repeat __________ times. Complete this exercise __________ times a day. Isometrics These are exercises in which you strengthen the muscles in your neck while keeping your neck still (isometrics). 1. Sit in a supportive chair and place your hand on your forehead. 2. Keep your head and face facing straight ahead. Do not flex or extend your neck while doing isometrics. 3. Push forward with your head and neck while pushing back with your hand. Hold for 10 seconds. 4. Do the sequence again, this time putting your hand against the back of your head. Use your head and neck to push backward against the hand pressure. 5. Finally, do the same exercise on either side of your head, pushing sideways against the pressure of your hand. Repeat __________ times. Complete this exercise __________ times a day. Prone head lifts 1. Lie face-down (prone position), resting on your elbows so that your chest and upper back are raised. 2. Start with your head facing downward, near your chest. Position your chin either on or near your chest. 3. Slowly lift your head upward. Lift until you are looking straight ahead. Then continue lifting your head as far back as you can comfortably stretch. 4. Hold your head up for 5 seconds. Then slowly lower it to your starting position. Repeat __________ times. Complete this exercise __________ times a day. Supine head lifts 1. Lie on your back (supine position), bending your knees to point to the ceiling and keeping your feet flat on the floor. 2. Lift your head slowly off the floor, raising your chin toward your chest. 3. Hold for 5 seconds. Repeat __________ times. Complete this exercise __________ times a  day. Scapular retraction 1. Stand with your arms at your sides. Look straight ahead. 2. Slowly pull both shoulders (scapulae) backward and downward (retraction) until you feel a stretch between your shoulder blades in your upper back. 3. Hold for 10-30 seconds. 4. Relax and repeat. Repeat __________ times. Complete this exercise __________ times a day. Contact a health care provider if:  Your neck pain or discomfort gets much worse when you do an exercise.  Your neck pain or discomfort does not improve within 2 hours after you exercise. If you have any of these problems, stop exercising right away. Do not do the exercises again unless your health care provider says that you can. Get help right away if:  You develop sudden, severe neck pain. If this happens, stop exercising right away. Do not do the exercises again unless your health care provider says that you can. This information is not intended to replace advice given to you by your health care  provider. Make sure you discuss any questions you have with your health care provider. Document Revised: 10/13/2018 Document Reviewed: 10/13/2018 Elsevier Patient Education  [place].

## 2020-07-29 DIAGNOSIS — J029 Acute pharyngitis, unspecified: Secondary | ICD-10-CM | POA: Insufficient documentation

## 2020-07-29 NOTE — Assessment & Plan Note (Signed)
Strep negative.  Likely viral etiology.  Recommend supportive and symptomatic treatment including increased fluids and warm salt water gargles.

## 2020-07-29 NOTE — Progress Notes (Signed)
Lauren Melton - 34 y.o. female MRN [id number]  Date of birth: 1986-02-14  Subjective Chief Complaint  Patient presents with  . Sore Throat    Itchy throat (son tested positive for Strep)    HPI Lauren Melton is a 34 y.o. female here today with complaint of sore throat.  She states that she has had scratchy throat with mild congestion and ear pressure for a couple of days.  She reports that her son recently tested + for strep and she would like to be tested as well.  She denies fever, chills, or difficulty swallowing.   She also have some pain on the R side of her neck with muscle tenderness.  This started prior to sore throat a couple of weeks ago.  Denies radiation of pain, numbness or tingling.    ROS:  A comprehensive ROS was completed and negative except as noted per HPI  No Known Allergies  Past Medical History:  Diagnosis Date  . 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 Hypertension     Past Surgical History:  Procedure Laterality Date  . APPENDECTOMY    . BRAIN TUMOR EXCISION  2002  . CESAREAN SECTION N/A 01/13/2016   Procedure: CESAREAN SECTION;  Surgeon: Louretta Shorten, MD;  Location: [facility];  Service: Obstetrics;  Laterality: N/A;    Social History   Socioeconomic History  . Marital status: Single    Spouse name: Not on file  . Number of children: Not on file  . Years of education: Not on file  . Highest education level: Not on file  Occupational History  . Not on file  Tobacco Use  . Smoking status: Never Smoker  . Smokeless tobacco: Never Used  Substance and Sexual Activity  . Alcohol use: No  . Drug use: No  . Sexual activity: Yes  Other Topics Concern  . Not on file  Social History Narrative  . Not on file   Social Determinants of Health   Financial Resource Strain:   . Difficulty of Paying Living Expenses:   Food Insecurity:   . Worried About Charity fundraiser in the Last Year:   . Arboriculturist in the Last Year:    Transportation Needs:   . Film/video editor (Medical):   [name] Kitchen Lack of Transportation (Non-Medical):   Physical Activity:   . Days of Exercise per Week:   . Minutes of Exercise per Session:   Stress:   . Feeling of Stress :   Social Connections:   . Frequency of Communication with Friends and Family:   . Frequency of Social Gatherings with Friends and Family:   . Attends Religious Services:   . Active Member of Clubs or Organizations:   . Attends Archivist Meetings:   [name] Kitchen Marital Status:     Family History  Problem Relation Age of Onset  . Arthritis Father   . Hypertension Father   . Cancer Maternal Aunt   . Breast cancer Maternal Aunt 27  . Diabetes Paternal Aunt   . Hypertension Paternal Aunt   . Cancer Maternal Grandfather   . Hypertension Paternal Grandmother   . Cancer Paternal Grandfather   . Diabetes Paternal Aunt   . Hypertension Paternal Aunt   . Breast cancer Cousin 40    Health Maintenance  Topic Date Due  . COVID-19 Vaccine (1) Never done  . INFLUENZA VACCINE  07/29/2020  . PAP SMEAR-Modifier  05/19/2021  . TETANUS/TDAP  09/28/2025  .  Hepatitis C Screening  Completed  . HIV Screening  Completed     ----------------------------------------------------------------------------------------------------------------------------------------------------------------------------------------------------------------- Physical Exam BP (!) 131/87   Pulse 76   Temp 98.1 F (36.7 C) (Oral)   Wt (!) 299 lb 1.9 oz (135.7 kg)   BMI 46.85 kg/m   Physical Exam Constitutional:      Appearance: Normal appearance.  HENT:     Head: Normocephalic and atraumatic.  Eyes:     General: No scleral icterus. Cardiovascular:     Rate and Rhythm: Normal rate and regular rhythm.     Pulses: Normal pulses.     Heart sounds: Normal heart sounds.  Musculoskeletal:     Cervical back: Neck supple.  Skin:    General: Skin is warm and dry.  Neurological:     General:  No focal deficit present.     Mental Status: She is alert.  Psychiatric:        Mood and Affect: Mood normal.        Behavior: Behavior normal.     ------------------------------------------------------------------------------------------------------------------------------------------------------------------------------------------------------------------- Assessment and Plan  Sore throat Strep negative.  Likely viral etiology.  Recommend supportive and symptomatic treatment including increased fluids and warm salt water gargles.   Chronic tension-type headache, not intractable Rx for flexeril renewed.    Meds ordered this encounter  Medications  . cyclobenzaprine (FLEXERIL) 10 MG tablet    Sig: Take 1 tablet (10 mg total) by mouth 3 (three) times daily as needed for muscle spasms.    Dispense:  30 tablet    Refill:  0    No follow-ups on file.    This visit occurred during the SARS-CoV-2 public health emergency.  Safety protocols were in place, including screening questions prior to the visit, additional usage of staff PPE, and extensive cleaning of exam room while observing appropriate contact time as indicated for disinfecting solutions.

## 2020-07-29 NOTE — Assessment & Plan Note (Signed)
Rx for flexeril renewed.

## 2020-07-31 DIAGNOSIS — Z20822 Contact with and (suspected) exposure to covid-19: Secondary | ICD-10-CM | POA: Diagnosis not present

## 2020-07-31 DIAGNOSIS — Z03818 Encounter for observation for suspected exposure to other biological agents ruled out: Secondary | ICD-10-CM | POA: Diagnosis not present

## 2020-08-06 ENCOUNTER — Other Ambulatory Visit: Payer: Self-pay | Admitting: Physician Assistant

## 2020-08-06 DIAGNOSIS — I10 Essential (primary) hypertension: Secondary | ICD-10-CM

## 2020-08-06 MED ORDER — BISOPROLOL-HYDROCHLOROTHIAZIDE 5-6.25 MG PO TABS: 1.0000 | ORAL_TABLET | Freq: Every day | ORAL | 0 refills | Status: DC

## 2020-09-11 ENCOUNTER — Ambulatory Visit (INDEPENDENT_AMBULATORY_CARE_PROVIDER_SITE_OTHER): Payer: BC Managed Care – PPO | Admitting: Physician Assistant

## 2020-09-11 ENCOUNTER — Encounter: Payer: Self-pay | Admitting: Physician Assistant

## 2020-09-11 VITALS — BP 115/65 | HR 66 | Ht 67.0 in | Wt 297.0 lb

## 2020-09-11 DIAGNOSIS — I10 Essential (primary) hypertension: Secondary | ICD-10-CM | POA: Diagnosis not present

## 2020-09-11 DIAGNOSIS — F4329 Adjustment disorder with other symptoms: Secondary | ICD-10-CM

## 2020-09-11 DIAGNOSIS — R4589 Other symptoms and signs involving emotional state: Secondary | ICD-10-CM

## 2020-09-11 DIAGNOSIS — Z7185 Encounter for immunization safety counseling: Secondary | ICD-10-CM

## 2020-09-11 DIAGNOSIS — Z0000 Encounter for general adult medical examination without abnormal findings: Secondary | ICD-10-CM | POA: Diagnosis not present

## 2020-09-11 DIAGNOSIS — G478 Other sleep disorders: Secondary | ICD-10-CM

## 2020-09-11 DIAGNOSIS — R5382 Chronic fatigue, unspecified: Secondary | ICD-10-CM

## 2020-09-11 DIAGNOSIS — Z7189 Other specified counseling: Secondary | ICD-10-CM

## 2020-09-11 DIAGNOSIS — Z113 Encounter for screening for infections with a predominantly sexual mode of transmission: Secondary | ICD-10-CM | POA: Diagnosis not present

## 2020-09-11 DIAGNOSIS — Z131 Encounter for screening for diabetes mellitus: Secondary | ICD-10-CM | POA: Diagnosis not present

## 2020-09-11 DIAGNOSIS — F4321 Adjustment disorder with depressed mood: Secondary | ICD-10-CM | POA: Insufficient documentation

## 2020-09-11 DIAGNOSIS — E559 Vitamin D deficiency, unspecified: Secondary | ICD-10-CM

## 2020-09-11 MED ORDER — ESCITALOPRAM OXALATE 5 MG PO TABS: 5.0000 mg | ORAL_TABLET | Freq: Every day | ORAL | 1 refills | Status: DC

## 2020-09-11 NOTE — Progress Notes (Signed)
Subjective:     Lauren Melton is a 34 y.o. female and is here for a comprehensive physical exam. The patient reports problems - she is very depressed. her mother died from covid earlier this year and she has not been able to feel better since. she denies any SI/HC or hallcinations. she has no family here and feels all alone. she does have her young son.   Social History   Socioeconomic History  . Marital status: Single    Spouse name: Not on file  . Number of children: Not on file  . Years of education: Not on file  . Highest education level: Not on file  Occupational History  . Not on file  Tobacco Use  . Smoking status: Never Smoker  . Smokeless tobacco: Never Used  Substance and Sexual Activity  . Alcohol use: No  . Drug use: No  . Sexual activity: Yes  Other Topics Concern  . Not on file  Social History Narrative  . Not on file   Social Determinants of Health   Financial Resource Strain:   . Difficulty of Paying Living Expenses: Not on file  Food Insecurity:   . Worried About Charity fundraiser in the Last Year: Not on file  . Ran Out of Food in the Last Year: Not on file  Transportation Needs:   . Lack of Transportation (Medical): Not on file  . Lack of Transportation (Non-Medical): Not on file  Physical Activity:   . Days of Exercise per Week: Not on file  . Minutes of Exercise per Session: Not on file  Stress:   . Feeling of Stress : Not on file  Social Connections:   . Frequency of Communication with Friends and Family: Not on file  . Frequency of Social Gatherings with Friends and Family: Not on file  . Attends Religious Services: Not on file  . Active Member of Clubs or Organizations: Not on file  . Attends Archivist Meetings: Not on file  . Marital Status: Not on file  Intimate Partner Violence:   . Fear of Current or Ex-Partner: Not on file  . Emotionally Abused: Not on file  . Physically Abused: Not on file  . Sexually Abused: Not on  file   Health Maintenance  Topic Date Due  . INFLUENZA VACCINE  Never done  . COVID-19 Vaccine (1) 09/27/2020 (Originally 01/07/1997)  . PAP SMEAR-Modifier  05/19/2021  . TETANUS/TDAP  09/28/2025  . Hepatitis C Screening  Completed  . HIV Screening  Completed    The following portions of the patient's history were reviewed and updated as appropriate: allergies, current medications, past family history, past medical history, past social history, past surgical history and problem list.  Review of Systems Pertinent items noted in HPI and remainder of comprehensive ROS otherwise negative.   Objective:    BP 115/65   Pulse 66   Ht 5\' 7"  (1.702 m)   Wt 297 lb (134.7 kg)   SpO2 100%   BMI 46.52 kg/m  General appearance: alert, cooperative, appears stated age and morbidly obese Head: Normocephalic, without obvious abnormality, atraumatic Eyes: conjunctivae/corneas clear. PERRL, EOM's intact. Fundi benign. Ears: normal TM's and external ear canals both ears Nose: Nares normal. Septum midline. Mucosa normal. No drainage or sinus tenderness. Throat: lips, mucosa, and tongue normal; teeth and gums normal Neck: no adenopathy, no carotid bruit, no JVD, supple, symmetrical, trachea midline and thyroid not enlarged, symmetric, no tenderness/mass/nodules Back: symmetric, no curvature. ROM  normal. No CVA tenderness. Lungs: clear to auscultation bilaterally Heart: regular rate and rhythm, S1, S2 normal, no murmur, click, rub or gallop Abdomen: soft, non-tender; bowel sounds normal; no masses,  no organomegaly Extremities: extremities normal, atraumatic, no cyanosis or edema Pulses: 2+ and symmetric Skin: Skin color, texture, turgor normal. No rashes or lesions Lymph nodes: Cervical, supraclavicular, and axillary nodes normal. Neurologic: Alert and oriented X 3, normal strength and tone. Normal symmetric reflexes. Normal coordination and gait   .[name] Kitchen Depression screen [facility] 2/9 09/11/2020 07/27/2019  05/19/2018 06/17/2017  Decreased Interest 0 0 0 0  Down, Depressed, Hopeless 1 0 0 0  PHQ - 2 Score 1 0 0 0  Altered sleeping 1 0 0 -  Tired, decreased energy 1 0 0 -  Change in appetite 1 0 0 -  Feeling bad or failure about yourself  0 0 0 -  Trouble concentrating 0 0 0 -  Moving slowly or fidgety/restless 0 0 0 -  Suicidal thoughts 1 0 0 -  PHQ-9 Score 5 0 0 -  Difficult doing work/chores Not difficult at all Not difficult at all - -   .[name] Kitchen GAD 7 : Generalized Anxiety Score 09/11/2020 07/27/2019 05/19/2018  Nervous, Anxious, on Edge 1 0 0  Control/stop worrying 1 0 0  Worry too much - different things 1 0 0  Trouble relaxing 1 0 0  Restless 0 0 0  Easily annoyed or irritable 1 0 0  Afraid - awful might happen 0 0 0  Total GAD 7 Score 5 0 0  Anxiety Difficulty Not difficult at all Not difficult at all Not difficult at all     Assessment:    Healthy female exam.      Plan:     [name] KitchenMarland KitchenSamathia was seen today for annual exam.  Diagnoses and all orders for this visit:  Routine physical examination -     COMPLETE METABOLIC PANEL WITH GFR -     Lipid Panel w/reflex Direct LDL -     TSH -     CBC -     Fe+TIBC+Fer -     HIV antibody (with reflex) -     RPR  Morbid obesity (HCC) -     TSH -     Home sleep test  Essential hypertension -     COMPLETE METABOLIC PANEL WITH GFR  Vitamin D insufficiency  Complicated grief -     escitalopram (LEXAPRO) 5 MG tablet; Take 1 tablet (5 mg total) by mouth daily.  Vaccine counseling  Screening for diabetes mellitus -     COMPLETE METABOLIC PANEL WITH GFR  Screening for STD (sexually transmitted disease) -     HIV antibody (with reflex) -     RPR -     C. trachomatis/N. gonorrhoeae RNA  Non-restorative sleep -     Home sleep test  Chronic fatigue -     Home sleep test  Depressed mood -     escitalopram (LEXAPRO) 5 MG tablet; Take 1 tablet (5 mg total) by mouth daily.   .. Discussed 150 minutes of exercise a week.   Encouraged vitamin D 1000 units and Calcium 1300mg  or 4 servings of dairy a day.  Fasting labs ordered.  Pap UTD. STD screening ordered.  Declined flu shot.  Considering covid. Vaccine counseling done.   BP controlled. Refilled medications.   PHQ/GAD numbers rising. Discussed grief. Started lexparo.  Follow up in 6 weeks. Strongly consider counseling. Discussed regular exercise.  See After Visit Summary for Counseling Recommendations

## 2020-09-11 NOTE — Patient Instructions (Signed)
Health Maintenance, Female Adopting a healthy lifestyle and getting preventive care are important in promoting health and wellness. Ask your health care provider about:  The right schedule for you to have regular tests and exams.  Things you can do on your own to prevent diseases and keep yourself healthy. What should I know about diet, weight, and exercise? Eat a healthy diet   Eat a diet that includes plenty of vegetables, fruits, low-fat dairy products, and lean protein.  Do not eat a lot of foods that are high in solid fats, added sugars, or sodium. Maintain a healthy weight Body mass index (BMI) is used to identify weight problems. It estimates body fat based on height and weight. Your health care provider can help determine your BMI and help you achieve or maintain a healthy weight. Get regular exercise Get regular exercise. This is one of the most important things you can do for your health. Most adults should:  Exercise for at least 150 minutes each week. The exercise should increase your heart rate and make you sweat (moderate-intensity exercise).  Do strengthening exercises at least twice a week. This is in addition to the moderate-intensity exercise.  Spend less time sitting. Even light physical activity can be beneficial. Watch cholesterol and blood lipids Have your blood tested for lipids and cholesterol at 35 years of age, then have this test every 5 years. Have your cholesterol levels checked more often if:  Your lipid or cholesterol levels are high.  You are older than 35 years of age.  You are at high risk for heart disease. What should I know about cancer screening? Depending on your health history and family history, you may need to have cancer screening at various ages. This may include screening for:  Breast cancer.  Cervical cancer.  Colorectal cancer.  Skin cancer.  Lung cancer. What should I know about heart disease, diabetes, and high blood  pressure? Blood pressure and heart disease  High blood pressure causes heart disease and increases the risk of stroke. This is more likely to develop in people who have high blood pressure readings, are of African descent, or are overweight.  Have your blood pressure checked: ? Every 3-5 years if you are 18-39 years of age. ? Every year if you are 40 years old or older. Diabetes Have regular diabetes screenings. This checks your fasting blood sugar level. Have the screening done:  Once every three years after age 40 if you are at a normal weight and have a low risk for diabetes.  More often and at a younger age if you are overweight or have a high risk for diabetes. What should I know about preventing infection? Hepatitis B If you have a higher risk for hepatitis B, you should be screened for this virus. Talk with your health care provider to find out if you are at risk for hepatitis B infection. Hepatitis C Testing is recommended for:  Everyone born from 1945 through 1965.  Anyone with known risk factors for hepatitis C. Sexually transmitted infections (STIs)  Get screened for STIs, including gonorrhea and chlamydia, if: ? You are sexually active and are younger than 35 years of age. ? You are older than 35 years of age and your health care provider tells you that you are at risk for this type of infection. ? Your sexual activity has changed since you were last screened, and you are at increased risk for chlamydia or gonorrhea. Ask your health care provider if   you are at risk.  Ask your health care provider about whether you are at high risk for HIV. Your health care provider may recommend a prescription medicine to help prevent HIV infection. If you choose to take medicine to prevent HIV, you should first get tested for HIV. You should then be tested every 3 months for as long as you are taking the medicine. Pregnancy  If you are about to stop having your period (premenopausal) and  you may become pregnant, seek counseling before you get pregnant.  Take 400 to 800 micrograms (mcg) of folic acid every day if you become pregnant.  Ask for birth control (contraception) if you want to prevent pregnancy. Osteoporosis and menopause Osteoporosis is a disease in which the bones lose minerals and strength with aging. This can result in bone fractures. If you are 65 years old or older, or if you are at risk for osteoporosis and fractures, ask your health care provider if you should:  Be screened for bone loss.  Take a calcium or vitamin D supplement to lower your risk of fractures.  Be given hormone replacement therapy (HRT) to treat symptoms of menopause. Follow these instructions at home: Lifestyle  Do not use any products that contain nicotine or tobacco, such as cigarettes, e-cigarettes, and chewing tobacco. If you need help quitting, ask your health care provider.  Do not use street drugs.  Do not share needles.  Ask your health care provider for help if you need support or information about quitting drugs. Alcohol use  Do not drink alcohol if: ? Your health care provider tells you not to drink. ? You are pregnant, may be pregnant, or are planning to become pregnant.  If you drink alcohol: ? Limit how much you use to 0-1 drink a day. ? Limit intake if you are breastfeeding.  Be aware of how much alcohol is in your drink. In the [place] one drink equals one 12 oz bottle of beer (355 mL), one 5 oz glass of wine (148 mL), or one 1 oz glass of hard liquor (44 mL). General instructions  Schedule regular health, dental, and eye exams.  Stay current with your vaccines.  Tell your health care provider if: ? You often feel depressed. ? You have ever been abused or do not feel safe at home. Summary  Adopting a healthy lifestyle and getting preventive care are important in promoting health and wellness.  Follow your health care provider's instructions about healthy  diet, exercising, and getting tested or screened for diseases.  Follow your health care provider's instructions on monitoring your cholesterol and blood pressure. This information is not intended to replace advice given to you by your health care provider. Make sure you discuss any questions you have with your health care provider. Document Revised: 12/08/2018 Document Reviewed: 12/08/2018 Elsevier Patient Education  2020 Elsevier Inc.  

## 2020-09-12 NOTE — Progress Notes (Signed)
Sam,   Kidney, liver, sugars look great.  Thyroid perfect! Hemoglobin better than in the past but very lower limits of normal. I would increase iron rich foods and consider MVI with iron in it.  Good cholesterol is low but has improved some. Eating good fats and staying active best way to improve HDL. LDL elevated some but watching processed foods and high fat meats and grease could help this number.

## 2020-09-13 LAB — COMPLETE METABOLIC PANEL WITH GFR
AG Ratio: 1.5 (calc) (ref 1.0–2.5)
ALT: 14 U/L (ref 6–29)
AST: 13 U/L (ref 10–30)
Albumin: 4.3 g/dL (ref 3.6–5.1)
Alkaline phosphatase (APISO): 62 U/L (ref 31–125)
BUN: 11 mg/dL (ref 7–25)
CO2: 26 mmol/L (ref 20–32)
Calcium: 9.1 mg/dL (ref 8.6–10.2)
Chloride: 104 mmol/L (ref 98–110)
Creat: 0.7 mg/dL (ref 0.50–1.10)
GFR, Est African American: 130 mL/min/{1.73_m2} (ref >=60)
GFR, Est Non African American: 112 mL/min/{1.73_m2} (ref >=60)
Globulin: 2.9 g/dL (calc) (ref 1.9–3.7)
Glucose, Bld: 89 mg/dL (ref 65–99)
Potassium: 4.4 mmol/L (ref 3.5–5.3)
Sodium: 138 mmol/L (ref 135–146)
Total Bilirubin: 0.3 mg/dL (ref 0.2–1.2)
Total Protein: 7.2 g/dL (ref 6.1–8.1)

## 2020-09-13 LAB — CBC
HCT: 34.1 % — ABNORMAL LOW (ref 35.0–45.0)
Hemoglobin: 11.7 g/dL (ref 11.7–15.5)
MCH: 28.8 pg (ref 27.0–33.0)
MCHC: 34.3 g/dL (ref 32.0–36.0)
MCV: 84 fL (ref 80.0–100.0)
MPV: 9.4 fL (ref 7.5–12.5)
Platelets: 327 10*3/uL (ref 140–400)
RBC: 4.06 10*6/uL (ref 3.80–5.10)
RDW: 15 % (ref 11.0–15.0)
WBC: 5.8 10*3/uL (ref 3.8–10.8)

## 2020-09-13 LAB — LIPID PANEL W/REFLEX DIRECT LDL
Cholesterol: 198 mg/dL (ref <200)
HDL: 40 mg/dL — ABNORMAL LOW (ref >=50)
LDL Cholesterol (Calc): 139 mg/dL (calc) — ABNORMAL HIGH
Non-HDL Cholesterol (Calc): 158 mg/dL (calc) — ABNORMAL HIGH (ref <130)
Total CHOL/HDL Ratio: 5 (calc) — ABNORMAL HIGH (ref <5.0)
Triglycerides: 93 mg/dL (ref <150)

## 2020-09-13 LAB — C. TRACHOMATIS/N. GONORRHOEAE RNA
C. trachomatis RNA, TMA: NOT DETECTED
N. gonorrhoeae RNA, TMA: NOT DETECTED

## 2020-09-13 LAB — IRON,TIBC AND FERRITIN PANEL
%SAT: 18 % (calc) (ref 16–45)
Ferritin: 48 ng/mL (ref 16–154)
Iron: 55 ug/dL (ref 40–190)
TIBC: 301 mcg/dL (calc) (ref 250–450)

## 2020-09-13 LAB — HIV ANTIBODY (ROUTINE TESTING W REFLEX): HIV 1&2 Ab, 4th Generation: NONREACTIVE

## 2020-09-13 LAB — TSH: TSH: 1.4 mIU/L

## 2020-09-13 NOTE — Progress Notes (Signed)
Lauren Melton,   STD screening negative.

## 2020-09-14 MED ORDER — BISOPROLOL-HYDROCHLOROTHIAZIDE 5-6.25 MG PO TABS: 1.0000 | ORAL_TABLET | Freq: Every day | ORAL | 3 refills | Status: DC

## 2020-11-09 ENCOUNTER — Encounter: Payer: Self-pay | Admitting: Physician Assistant

## 2020-12-05 DIAGNOSIS — Z20822 Contact with and (suspected) exposure to covid-19: Secondary | ICD-10-CM | POA: Diagnosis not present

## 2020-12-07 DIAGNOSIS — N760 Acute vaginitis: Secondary | ICD-10-CM | POA: Diagnosis not present

## 2020-12-13 DIAGNOSIS — Z20822 Contact with and (suspected) exposure to covid-19: Secondary | ICD-10-CM | POA: Diagnosis not present

## 2020-12-19 DIAGNOSIS — Z20822 Contact with and (suspected) exposure to covid-19: Secondary | ICD-10-CM | POA: Diagnosis not present

## 2020-12-25 DIAGNOSIS — Z20822 Contact with and (suspected) exposure to covid-19: Secondary | ICD-10-CM | POA: Diagnosis not present

## 2021-01-03 ENCOUNTER — Encounter: Payer: Self-pay | Admitting: Physician Assistant

## 2021-02-18 ENCOUNTER — Encounter: Payer: Self-pay | Admitting: Physician Assistant

## 2021-02-18 DIAGNOSIS — F4321 Adjustment disorder with depressed mood: Secondary | ICD-10-CM

## 2021-02-18 DIAGNOSIS — R4589 Other symptoms and signs involving emotional state: Secondary | ICD-10-CM

## 2021-02-18 MED ORDER — ESCITALOPRAM OXALATE 5 MG PO TABS: 5.0000 mg | ORAL_TABLET | Freq: Every day | ORAL | 0 refills | Status: DC

## 2021-03-19 ENCOUNTER — Other Ambulatory Visit: Payer: Self-pay

## 2021-03-19 ENCOUNTER — Ambulatory Visit: Payer: BC Managed Care – PPO | Admitting: Physician Assistant

## 2021-03-19 ENCOUNTER — Encounter: Payer: Self-pay | Admitting: Physician Assistant

## 2021-03-19 VITALS — BP 125/77 | HR 77 | Ht 67.0 in | Wt 306.0 lb

## 2021-03-19 DIAGNOSIS — G43009 Migraine without aura, not intractable, without status migrainosus: Secondary | ICD-10-CM | POA: Diagnosis not present

## 2021-03-19 DIAGNOSIS — F4321 Adjustment disorder with depressed mood: Secondary | ICD-10-CM

## 2021-03-19 DIAGNOSIS — R4589 Other symptoms and signs involving emotional state: Secondary | ICD-10-CM | POA: Insufficient documentation

## 2021-03-19 MED ORDER — RIZATRIPTAN BENZOATE 10 MG PO TABS: 10.0000 mg | ORAL_TABLET | ORAL | 0 refills | Status: DC | PRN

## 2021-03-19 MED ORDER — ESCITALOPRAM OXALATE 5 MG PO TABS: 5.0000 mg | ORAL_TABLET | Freq: Every day | ORAL | 1 refills | Status: DC

## 2021-03-19 MED ORDER — DEXAMETHASONE SODIUM PHOSPHATE 10 MG/ML IJ SOLN
8.0000 mg | Freq: Once | INTRAMUSCULAR | Status: AC
  Administered 2021-03-19: 8 mg via INTRAMUSCULAR

## 2021-03-19 MED ORDER — KETOROLAC TROMETHAMINE 60 MG/2ML IM SOLN
60.0000 mg | Freq: Once | INTRAMUSCULAR | Status: AC
  Administered 2021-03-19: 60 mg via INTRAMUSCULAR

## 2021-03-19 MED ORDER — PROMETHAZINE HCL 25 MG PO TABS: 25.0000 mg | ORAL_TABLET | Freq: Four times a day (QID) | ORAL | 0 refills | Status: DC | PRN

## 2021-03-19 NOTE — Patient Instructions (Signed)

## 2021-03-19 NOTE — Progress Notes (Signed)
Subjective:    Patient ID: Lauren Melton, female    DOB: 10/31/1986, 35 y.o.   MRN: [id number]  HPI  Patient is a 35 year old obese female who presents to the clinic with sinus pressure in between her eyes and migraine-like headache.  She is having some nausea and light sensitivity.  She denies any vomiting, vision changes, speech changes, numbness or tingling. excedrin migraine takes the edge off from 9/10 to 7/10. Started yesterday morning. Pt has hx of allergies. No fever, chills, body aches, cough, SOB, ST. She does have some ear popping and lots of sinus pressure but not blowing anything out.   She continues to take lexapro. She stopped and all her emotions came back. She is not ready to be off medication. No SI/HC. She is not ready for counseling per patient.   .. Active Ambulatory Problems    Diagnosis Date Noted  . Cesarean delivery delivered 01/14/2016  . Essential hypertension 08/13/2016  . History of benign brain tumor 08/13/2016  . 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 No energy 08/27/2016  . Morbid obesity ([place]) 08/27/2016  . Vitamin D insufficiency 08/29/2016  . Slow transit constipation 05/26/2018  . Acute stress reaction 09/09/2018  . New daily persistent headache 07/27/2019  . Chronic tension-type headache, not intractable 08/02/2019  . Snoring 08/02/2019  . Non-restorative sleep 08/02/2019  . Grief reaction 04/17/2020  . Acute bilateral low back pain without sciatica 04/17/2020  . Sore throat 07/29/2020  . Complicated grief 61/60/7371  . Migraine without aura and without status migrainosus, not intractable 03/19/2021  . Depressed mood 03/19/2021   Resolved Ambulatory Problems    Diagnosis Date Noted  . No Resolved Ambulatory Problems   Past Medical History:  Diagnosis Date  . HSV-2 seropositive   . Hypertension        Review of Systems    see HPI.  Objective:   Physical Exam Vitals reviewed.  Constitutional:      Appearance: She is well-developed.  She is obese.  HENT:     Head: Normocephalic.     Comments: Tenderness over nasal bridge in between eyes.     Mouth/Throat:     Mouth: Mucous membranes are moist.  Eyes:     Extraocular Movements: Extraocular movements intact.     Pupils: Pupils are equal, round, and reactive to light.     Comments: Bilateral nasal turbinates red and swollen.   Cardiovascular:     Rate and Rhythm: Normal rate and regular rhythm.     Heart sounds: Normal heart sounds.  Pulmonary:     Effort: Pulmonary effort is normal.     Breath sounds: Normal breath sounds.  Abdominal:     General: Bowel sounds are normal. There is no distension.     Tenderness: There is no abdominal tenderness.  Musculoskeletal:     Cervical back: Normal range of motion.  Lymphadenopathy:     Cervical: No cervical adenopathy.  Neurological:     Mental Status: She is alert.  Psychiatric:     Comments: Flat affect.            Assessment & Plan:  [name] KitchenMarland KitchenSamathia was seen today for headache.  Diagnoses and all orders for this visit:  Migraine without aura and without status migrainosus, not intractable  Complicated grief -     escitalopram (LEXAPRO) 5 MG tablet; Take 1 tablet (5 mg total) by mouth daily.  Depressed mood -     escitalopram (LEXAPRO) 5 MG tablet; Take 1 tablet (  5 mg total) by mouth daily.  Other orders -     rizatriptan (MAXALT) 10 MG tablet; Take 1 tablet (10 mg total) by mouth as needed for migraine. May repeat in 2 hours if needed -     promethazine (PHENERGAN) 25 MG tablet; Take 1 tablet (25 mg total) by mouth every 6 (six) hours as needed for nausea or vomiting.   Written out of work for today.  IM decadron and Toradol given.  Continue flonase daily.  Add phenergan for nausea once you get home and as needed for nausea.  No neuro symptoms.  Suspect allergies as trigger.  maxalt given to use as needed for any future migraines.  Follow up as needed and if symptoms worsen or change.   Refilled  lexapro. Encouraged patient to set up counseling.  Follow up in 6 months.

## 2021-06-12 ENCOUNTER — Telehealth: Payer: Managed Care, Other (non HMO) | Admitting: Physician Assistant

## 2021-08-19 ENCOUNTER — Encounter: Payer: Managed Care, Other (non HMO) | Admitting: Physician Assistant

## 2021-09-09 LAB — HM PAP SMEAR: HM Pap smear: NEGATIVE

## 2021-09-10 ENCOUNTER — Encounter: Payer: Managed Care, Other (non HMO) | Admitting: Physician Assistant

## 2021-09-10 DIAGNOSIS — E559 Vitamin D deficiency, unspecified: Secondary | ICD-10-CM

## 2021-09-10 DIAGNOSIS — Z13220 Encounter for screening for lipoid disorders: Secondary | ICD-10-CM

## 2021-09-10 DIAGNOSIS — Z0000 Encounter for general adult medical examination without abnormal findings: Secondary | ICD-10-CM

## 2021-09-10 DIAGNOSIS — Z1329 Encounter for screening for other suspected endocrine disorder: Secondary | ICD-10-CM

## 2021-09-10 DIAGNOSIS — I10 Essential (primary) hypertension: Secondary | ICD-10-CM

## 2021-09-10 DIAGNOSIS — Z131 Encounter for screening for diabetes mellitus: Secondary | ICD-10-CM

## 2021-09-23 ENCOUNTER — Other Ambulatory Visit: Payer: Self-pay

## 2021-09-23 ENCOUNTER — Encounter: Payer: Self-pay | Admitting: Physician Assistant

## 2021-09-23 ENCOUNTER — Ambulatory Visit (INDEPENDENT_AMBULATORY_CARE_PROVIDER_SITE_OTHER): Payer: Managed Care, Other (non HMO) | Admitting: Physician Assistant

## 2021-09-23 VITALS — BP 133/76 | HR 70 | Ht 67.0 in | Wt 309.0 lb

## 2021-09-23 DIAGNOSIS — Z13220 Encounter for screening for lipoid disorders: Secondary | ICD-10-CM

## 2021-09-23 DIAGNOSIS — M25562 Pain in left knee: Secondary | ICD-10-CM

## 2021-09-23 DIAGNOSIS — R4589 Other symptoms and signs involving emotional state: Secondary | ICD-10-CM

## 2021-09-23 DIAGNOSIS — I10 Essential (primary) hypertension: Secondary | ICD-10-CM

## 2021-09-23 DIAGNOSIS — F4321 Adjustment disorder with depressed mood: Secondary | ICD-10-CM

## 2021-09-23 DIAGNOSIS — Z131 Encounter for screening for diabetes mellitus: Secondary | ICD-10-CM

## 2021-09-23 DIAGNOSIS — Z1329 Encounter for screening for other suspected endocrine disorder: Secondary | ICD-10-CM

## 2021-09-23 DIAGNOSIS — G43009 Migraine without aura, not intractable, without status migrainosus: Secondary | ICD-10-CM

## 2021-09-23 DIAGNOSIS — Z0000 Encounter for general adult medical examination without abnormal findings: Secondary | ICD-10-CM

## 2021-09-23 DIAGNOSIS — Z6841 Body Mass Index (BMI) 40.0 and over, adult: Secondary | ICD-10-CM

## 2021-09-23 DIAGNOSIS — E559 Vitamin D deficiency, unspecified: Secondary | ICD-10-CM

## 2021-09-23 MED ORDER — ESCITALOPRAM OXALATE 10 MG PO TABS: 10.0000 mg | ORAL_TABLET | Freq: Every day | ORAL | 3 refills | Status: DC

## 2021-09-23 MED ORDER — BISOPROLOL-HYDROCHLOROTHIAZIDE 5-6.25 MG PO TABS: 1.0000 | ORAL_TABLET | Freq: Every day | ORAL | 3 refills | Status: DC

## 2021-09-23 MED ORDER — RIZATRIPTAN BENZOATE 5 MG PO TABS: 5.0000 mg | ORAL_TABLET | ORAL | 5 refills | Status: DC | PRN

## 2021-09-23 NOTE — Patient Instructions (Signed)
Health Maintenance, Female Adopting a healthy lifestyle and getting preventive care are important in promoting health and wellness. Ask your health care provider about: The right schedule for you to have regular tests and exams. Things you can do on your own to prevent diseases and keep yourself healthy. What should I know about diet, weight, and exercise? Eat a healthy diet  Eat a diet that includes plenty of vegetables, fruits, low-fat dairy products, and lean protein. Do not eat a lot of foods that are high in solid fats, added sugars, or sodium. Maintain a healthy weight Body mass index (BMI) is used to identify weight problems. It estimates body fat based on height and weight. Your health care provider can help determine your BMI and help you achieve or maintain a healthy weight. Get regular exercise Get regular exercise. This is one of the most important things you can do for your health. Most adults should: Exercise for at least 150 minutes each week. The exercise should increase your heart rate and make you sweat (moderate-intensity exercise). Do strengthening exercises at least twice a week. This is in addition to the moderate-intensity exercise. Spend less time sitting. Even light physical activity can be beneficial. Watch cholesterol and blood lipids Have your blood tested for lipids and cholesterol at 36 years of age, then have this test every 5 years. Have your cholesterol levels checked more often if: Your lipid or cholesterol levels are high. You are older than 36 years of age. You are at high risk for heart disease. What should I know about cancer screening? Depending on your health history and family history, you may need to have cancer screening at various ages. This may include screening for: Breast cancer. Cervical cancer. Colorectal cancer. Skin cancer. Lung cancer. What should I know about heart disease, diabetes, and high blood pressure? Blood pressure and heart  disease High blood pressure causes heart disease and increases the risk of stroke. This is more likely to develop in people who have high blood pressure readings, are of African descent, or are overweight. Have your blood pressure checked: Every 3-5 years if you are 18-39 years of age. Every year if you are 40 years old or older. Diabetes Have regular diabetes screenings. This checks your fasting blood sugar level. Have the screening done: Once every three years after age 40 if you are at a normal weight and have a low risk for diabetes. More often and at a younger age if you are overweight or have a high risk for diabetes. What should I know about preventing infection? Hepatitis B If you have a higher risk for hepatitis B, you should be screened for this virus. Talk with your health care provider to find out if you are at risk for hepatitis B infection. Hepatitis C Testing is recommended for: Everyone born from 1945 through 1965. Anyone with known risk factors for hepatitis C. Sexually transmitted infections (STIs) Get screened for STIs, including gonorrhea and chlamydia, if: You are sexually active and are younger than 36 years of age. You are older than 36 years of age and your health care provider tells you that you are at risk for this type of infection. Your sexual activity has changed since you were last screened, and you are at increased risk for chlamydia or gonorrhea. Ask your health care provider if you are at risk. Ask your health care provider about whether you are at high risk for HIV. Your health care provider may recommend a prescription medicine   to help prevent HIV infection. If you choose to take medicine to prevent HIV, you should first get tested for HIV. You should then be tested every 3 months for as long as you are taking the medicine. Pregnancy If you are about to stop having your period (premenopausal) and you may become pregnant, seek counseling before you get  pregnant. Take 400 to 800 micrograms (mcg) of folic acid every day if you become pregnant. Ask for birth control (contraception) if you want to prevent pregnancy. Osteoporosis and menopause Osteoporosis is a disease in which the bones lose minerals and strength with aging. This can result in bone fractures. If you are 65 years old or older, or if you are at risk for osteoporosis and fractures, ask your health care provider if you should: Be screened for bone loss. Take a calcium or vitamin D supplement to lower your risk of fractures. Be given hormone replacement therapy (HRT) to treat symptoms of menopause. Follow these instructions at home: Lifestyle Do not use any products that contain nicotine or tobacco, such as cigarettes, e-cigarettes, and chewing tobacco. If you need help quitting, ask your health care provider. Do not use street drugs. Do not share needles. Ask your health care provider for help if you need support or information about quitting drugs. Alcohol use Do not drink alcohol if: Your health care provider tells you not to drink. You are pregnant, may be pregnant, or are planning to become pregnant. If you drink alcohol: Limit how much you use to 0-1 drink a day. Limit intake if you are breastfeeding. Be aware of how much alcohol is in your drink. In the [place] one drink equals one 12 oz bottle of beer (355 mL), one 5 oz glass of wine (148 mL), or one 1 oz glass of hard liquor (44 mL). General instructions Schedule regular health, dental, and eye exams. Stay current with your vaccines. Tell your health care provider if: You often feel depressed. You have ever been abused or do not feel safe at home. Summary Adopting a healthy lifestyle and getting preventive care are important in promoting health and wellness. Follow your health care provider's instructions about healthy diet, exercising, and getting tested or screened for diseases. Follow your health care provider's  instructions on monitoring your cholesterol and blood pressure. This information is not intended to replace advice given to you by your health care provider. Make sure you discuss any questions you have with your health care provider. Document Revised: 02/22/2021 Document Reviewed: 12/08/2018 Elsevier Patient Education  2022 Elsevier Inc.  

## 2021-09-23 NOTE — Progress Notes (Signed)
Subjective:     Lauren Melton is a 35 y.o. female and is here for a comprehensive physical exam. The patient reports problems - she is having some left knee pain for about 1 month. No injury/fall/trauma .   Social History   Socioeconomic History   Marital status: Single    Spouse name: Not on file   Number of children: Not on file   Years of education: Not on file   Highest education level: Not on file  Occupational History   Not on file  Tobacco Use   Smoking status: Never   Smokeless tobacco: Never  Substance and Sexual Activity   Alcohol use: No   Drug use: No   Sexual activity: Yes  Other Topics Concern   Not on file  Social History Narrative   Not on file   Social Determinants of Health   Financial Resource Strain: Not on file  Food Insecurity: Not on file  Transportation Needs: Not on file  Physical Activity: Not on file  Stress: Not on file  Social Connections: Not on file  Intimate Partner Violence: Not on file   Health Maintenance  Topic Date Due   COVID-19 Vaccine (1) 10/09/2021 (Originally 07/07/1986)   INFLUENZA VACCINE  03/28/2022 (Originally 07/29/2021)   PAP SMEAR-Modifier  09/09/2024   TETANUS/TDAP  09/28/2025   Hepatitis C Screening  Completed   HIV Screening  Completed   HPV VACCINES  Aged Out    The following portions of the patient's history were reviewed and updated as appropriate: allergies, current medications, past family history, past medical history, past social history, past surgical history, and problem list.  Review of Systems Pertinent items noted in HPI and remainder of comprehensive ROS otherwise negative.   Objective:    BP 133/76   Pulse 70   Ht 5\' 7"  (1.702 m)   Wt (!) 309 lb (140.2 kg)   SpO2 100%   BMI 48.40 kg/m  General appearance: alert, cooperative, and appears stated age obesity Head: Normocephalic, without obvious abnormality, atraumatic Eyes: conjunctivae/corneas clear. PERRL, EOM's intact. Fundi  benign. Ears: normal TM's and external ear canals both ears Nose: Nares normal. Septum midline. Mucosa normal. No drainage or sinus tenderness. Throat: lips, mucosa, and tongue normal; teeth and gums normal Neck: no adenopathy, no carotid bruit, no JVD, supple, symmetrical, trachea midline, and thyroid not enlarged, symmetric, no tenderness/mass/nodules Back: symmetric, no curvature. ROM normal. No CVA tenderness. Lungs: clear to auscultation bilaterally Heart: regular rate and rhythm, S1, S2 normal, no murmur, click, rub or gallop Abdomen: soft, non-tender; bowel sounds normal; no masses,  no organomegaly Extremities: extremities normal, atraumatic, no cyanosis or edema Left knee tenderness over patella. NROM. Strength 5/5. No joint tenderness.  Pulses: 2+ and symmetric Skin: Skin color, texture, turgor normal. No rashes or lesions Lymph nodes: Cervical, supraclavicular, and axillary nodes normal. Neurologic: Grossly normal   .. Depression screen [facility] 2/9 09/23/2021 09/11/2020 07/27/2019 05/19/2018 06/17/2017  Decreased Interest 0 0 0 0 0  Down, Depressed, Hopeless 0 1 0 0 0  PHQ - 2 Score 0 1 0 0 0  Altered sleeping 0 1 0 0 -  Tired, decreased energy 0 1 0 0 -  Change in appetite 0 1 0 0 -  Feeling bad or failure about yourself  0 0 0 0 -  Trouble concentrating 0 0 0 0 -  Moving slowly or fidgety/restless 0 0 0 0 -  Suicidal thoughts 0 1 0 0 -  PHQ-9 Score  0 5 0 0 -  Difficult doing work/chores Not difficult at all Not difficult at all Not difficult at all - -   .[name] Kitchen GAD 7 : Generalized Anxiety Score 09/23/2021 09/11/2020 07/27/2019 05/19/2018  Nervous, Anxious, on Edge 0 1 0 0  Control/stop worrying 0 1 0 0  Worry too much - different things 0 1 0 0  Trouble relaxing 0 1 0 0  Restless 0 0 0 0  Easily annoyed or irritable 0 1 0 0  Afraid - awful might happen 0 0 0 0  Total GAD 7 Score 0 5 0 0  Anxiety Difficulty Not difficult at all Not difficult at all Not difficult at all Not difficult  at all     Assessment:    Healthy female exam.     Plan:  [name] KitchenMarland KitchenSamathia was seen today for annual exam.  Diagnoses and all orders for this visit:  Routine physical examination -     TSH -     Lipid Panel w/reflex Direct LDL -     COMPLETE METABOLIC PANEL WITH GFR -     CBC with Differential/Platelet -     Vitamin D (25 hydroxy)  Essential hypertension -     COMPLETE METABOLIC PANEL WITH GFR -     bisoprolol-hydrochlorothiazide (ZIAC) 5-6.25 MG tablet; Take 1 tablet by mouth daily.  Vitamin D insufficiency -     Vitamin D (25 hydroxy)  Screening for diabetes mellitus -     COMPLETE METABOLIC PANEL WITH GFR  Thyroid disorder screen -     TSH  Lipid screening -     Lipid Panel w/reflex Direct LDL  Complicated grief -     escitalopram (LEXAPRO) 10 MG tablet; Take 1 tablet (10 mg total) by mouth daily.  Depressed mood -     escitalopram (LEXAPRO) 10 MG tablet; Take 1 tablet (10 mg total) by mouth daily.  Class 3 severe obesity due to excess calories without serious comorbidity with body mass index (BMI) of 45.0 to 49.9 in adult [facility])  Migraine without aura and without status migrainosus, not intractable -     rizatriptan (MAXALT) 5 MG tablet; Take 1 tablet (5 mg total) by mouth as needed for migraine. May repeat in 2 hours if needed  Acute pain of left knee  .[name] Kitchen Discussed 150 minutes of exercise a week.  Encouraged vitamin D 1000 units and Calcium 1300mg  or 4 servings of dairy a day.  PHQ/GAD numbers look good. Request to go up to 10mg . Feels better there. Sent refill of lexapro.  BP looks great. Refilled Ziac.  Fasting labs ordered.  Pap UTD.  Declined covid/flu vaccine.   Discussed knee pain. Sounds like patellofemoral syndrome. HO given. Voltaren gel given. Ok to use patella strap. Follow up with Dr. Darene Lamer if continues to bother you.     See After Visit Summary for Counseling Recommendations

## 2021-09-24 ENCOUNTER — Encounter: Payer: Self-pay | Admitting: Physician Assistant

## 2021-09-24 NOTE — Progress Notes (Signed)
Lauren Melton,   Thyroid looks great.  Kidney, liver, glucose look great.  Vitamin d is in normal range but just barely. Double the vitamin D you are taking and make sure you take with fatty/protein meal for better absorption.  Platelets are up a little and hemoglobin are down. Please add ferritin and serum iron HDL is low and LDL not quite to goal. Your overall risk is low currently. Continue to try with diet and exercise to get these numbers to optimal level.

## 2021-09-27 LAB — LIPID PANEL W/REFLEX DIRECT LDL
Cholesterol: 197 mg/dL (ref <200)
HDL: 36 mg/dL — ABNORMAL LOW (ref >=50)
LDL Cholesterol (Calc): 138 mg/dL (calc) — ABNORMAL HIGH
Non-HDL Cholesterol (Calc): 161 mg/dL (calc) — ABNORMAL HIGH (ref <130)
Total CHOL/HDL Ratio: 5.5 (calc) — ABNORMAL HIGH (ref <5.0)
Triglycerides: 110 mg/dL (ref <150)

## 2021-09-27 LAB — CBC WITH DIFFERENTIAL/PLATELET
Absolute Monocytes: 506 cells/uL (ref 200–950)
Basophils Absolute: 58 cells/uL (ref 0–200)
Basophils Relative: 0.9 %
Eosinophils Absolute: 243 cells/uL (ref 15–500)
Eosinophils Relative: 3.8 %
HCT: 33 % — ABNORMAL LOW (ref 35.0–45.0)
Hemoglobin: 11.1 g/dL — ABNORMAL LOW (ref 11.7–15.5)
Lymphs Abs: 2797 cells/uL (ref 850–3900)
MCH: 28.1 pg (ref 27.0–33.0)
MCHC: 33.6 g/dL (ref 32.0–36.0)
MCV: 83.5 fL (ref 80.0–100.0)
MPV: 9.3 fL (ref 7.5–12.5)
Monocytes Relative: 7.9 %
Neutro Abs: 2797 cells/uL (ref 1500–7800)
Neutrophils Relative %: 43.7 %
Platelets: 454 10*3/uL — ABNORMAL HIGH (ref 140–400)
RBC: 3.95 10*6/uL (ref 3.80–5.10)
RDW: 14.4 % (ref 11.0–15.0)
Total Lymphocyte: 43.7 %
WBC: 6.4 10*3/uL (ref 3.8–10.8)

## 2021-09-27 LAB — IRON,TIBC AND FERRITIN PANEL
%SAT: 40 % (calc) (ref 16–45)
Ferritin: 70 ng/mL (ref 16–154)
Iron: 110 ug/dL (ref 40–190)
TIBC: 273 mcg/dL (calc) (ref 250–450)

## 2021-09-27 LAB — COMPLETE METABOLIC PANEL WITH GFR
AG Ratio: 1.5 (calc) (ref 1.0–2.5)
ALT: 15 U/L (ref 6–29)
AST: 10 U/L (ref 10–30)
Albumin: 4.1 g/dL (ref 3.6–5.1)
Alkaline phosphatase (APISO): 54 U/L (ref 31–125)
BUN: 10 mg/dL (ref 7–25)
CO2: 25 mmol/L (ref 20–32)
Calcium: 9.2 mg/dL (ref 8.6–10.2)
Chloride: 104 mmol/L (ref 98–110)
Creat: 0.67 mg/dL (ref 0.50–0.97)
Globulin: 2.7 g/dL (calc) (ref 1.9–3.7)
Glucose, Bld: 94 mg/dL (ref 65–99)
Potassium: 3.9 mmol/L (ref 3.5–5.3)
Sodium: 135 mmol/L (ref 135–146)
Total Bilirubin: 0.5 mg/dL (ref 0.2–1.2)
Total Protein: 6.8 g/dL (ref 6.1–8.1)
eGFR: 116 mL/min/{1.73_m2} (ref >=60)

## 2021-09-27 LAB — TSH: TSH: 0.97 mIU/L

## 2021-09-27 LAB — VITAMIN D 25 HYDROXY (VIT D DEFICIENCY, FRACTURES): Vit D, 25-Hydroxy: 31 ng/mL (ref 30–100)

## 2021-09-27 NOTE — Progress Notes (Signed)
Serum iron looks good. Iron stores look good. Just keep iron rich foods in diet.

## 2022-02-24 ENCOUNTER — Ambulatory Visit: Payer: Managed Care, Other (non HMO) | Admitting: Physician Assistant

## 2022-02-24 ENCOUNTER — Ambulatory Visit (INDEPENDENT_AMBULATORY_CARE_PROVIDER_SITE_OTHER): Payer: Managed Care, Other (non HMO)

## 2022-02-24 ENCOUNTER — Other Ambulatory Visit: Payer: Self-pay

## 2022-02-24 ENCOUNTER — Encounter: Payer: Self-pay | Admitting: Physician Assistant

## 2022-02-24 VITALS — BP 141/75 | HR 72 | Ht 67.0 in | Wt 311.0 lb

## 2022-02-24 DIAGNOSIS — R519 Headache, unspecified: Secondary | ICD-10-CM | POA: Diagnosis not present

## 2022-02-24 DIAGNOSIS — M549 Dorsalgia, unspecified: Secondary | ICD-10-CM | POA: Diagnosis not present

## 2022-02-24 DIAGNOSIS — M542 Cervicalgia: Secondary | ICD-10-CM | POA: Diagnosis not present

## 2022-02-24 DIAGNOSIS — G43009 Migraine without aura, not intractable, without status migrainosus: Secondary | ICD-10-CM | POA: Diagnosis not present

## 2022-02-24 DIAGNOSIS — R0789 Other chest pain: Secondary | ICD-10-CM

## 2022-02-24 DIAGNOSIS — R002 Palpitations: Secondary | ICD-10-CM

## 2022-02-24 DIAGNOSIS — F4321 Adjustment disorder with depressed mood: Secondary | ICD-10-CM

## 2022-02-24 MED ORDER — RIZATRIPTAN BENZOATE 5 MG PO TABS: 5.0000 mg | ORAL_TABLET | ORAL | 5 refills | Status: DC | PRN

## 2022-02-24 MED ORDER — MELOXICAM 15 MG PO TABS: 15.0000 mg | ORAL_TABLET | Freq: Every day | ORAL | 1 refills | Status: DC

## 2022-02-24 MED ORDER — BACLOFEN 10 MG PO TABS: 10.0000 mg | ORAL_TABLET | Freq: Three times a day (TID) | ORAL | 2 refills | Status: DC

## 2022-02-24 NOTE — Patient Instructions (Addendum)
Tens unit/heat and ice/exercises/mobic daily for 2 weeks then as needed Get xray Neck Exercises Ask your health care provider which exercises are safe for you. Do exercises exactly as told by your health care provider and adjust them as directed. It is normal to feel mild stretching, pulling, tightness, or discomfort as you do these exercises. Stop right away if you feel sudden pain or your pain gets worse. Do not begin these exercises until told by your health care provider. Neck exercises can be important for many reasons. They can improve strength and maintain flexibility in your neck, which will help your upper back and prevent neck pain. Stretching exercises Rotation neck stretching  Sit in a chair or stand up. Place your feet flat on the floor, shoulder-width apart. Slowly turn your head (rotate) to the right until a slight stretch is felt. Turn it all the way to the right so you can look over your right shoulder. Do not tilt or tip your head. Hold this position for 10-30 seconds. Slowly turn your head (rotate) to the left until a slight stretch is felt. Turn it all the way to the left so you can look over your left shoulder. Do not tilt or tip your head. Hold this position for 10-30 seconds. Repeat __________ times. Complete this exercise __________ times a day. Neck retraction  Sit in a sturdy chair or stand up. Look straight ahead. Do not bend your neck. Use your fingers to push your chin backward (retraction). Do not bend your neck for this movement. Continue to face straight ahead. If you are doing the exercise properly, you will feel a slight sensation in your throat and a stretch at the back of your neck. Hold the stretch for 1-2 seconds. Repeat __________ times. Complete this exercise __________ times a day. Strengthening exercises Neck press  Lie on your back on a firm bed or on the floor with a pillow under your head. Use your neck muscles to push your head down on the  pillow and straighten your spine. Hold the position as well as you can. Keep your head facing up (in a neutral position) and your chin tucked. Slowly count to 5 while holding this position. Repeat __________ times. Complete this exercise __________ times a day. Isometrics These are exercises in which you strengthen the muscles in your neck while keeping your neck still (isometrics). Sit in a supportive chair and place your hand on your forehead. Keep your head and face facing straight ahead. Do not flex or extend your neck while doing isometrics. Push forward with your head and neck while pushing back with your hand. Hold for 10 seconds. Do the sequence again, this time putting your hand against the back of your head. Use your head and neck to push backward against the hand pressure. Finally, do the same exercise on either side of your head, pushing sideways against the pressure of your hand. Repeat __________ times. Complete this exercise __________ times a day. Prone head lifts  Lie face-down (prone position), resting on your elbows so that your chest and upper back are raised. Start with your head facing downward, near your chest. Position your chin either on or near your chest. Slowly lift your head upward. Lift until you are looking straight ahead. Then continue lifting your head as far back as you can comfortably stretch. Hold your head up for 5 seconds. Then slowly lower it to your starting position. Repeat __________ times. Complete this exercise __________ times a day.  Supine head lifts  Lie on your back (supine position), bending your knees to point to the ceiling and keeping your feet flat on the floor. Lift your head slowly off the floor, raising your chin toward your chest. Hold for 5 seconds. Repeat __________ times. Complete this exercise __________ times a day. Scapular retraction  Stand with your arms at your sides. Look straight ahead. Slowly pull both shoulders  (scapulae) backward and downward (retraction) until you feel a stretch between your shoulder blades in your upper back. Hold for 10-30 seconds. Relax and repeat. Repeat __________ times. Complete this exercise __________ times a day. Contact a health care provider if: Your neck pain or discomfort gets worse when you do an exercise. Your neck pain or discomfort does not improve within 2 hours after you exercise. If you have any of these problems, stop exercising right away. Do not do the exercises again unless your health care provider says that you can. Get help right away if: You develop sudden, severe neck pain. If this happens, stop exercising right away. Do not do the exercises again unless your health care provider says that you can. This information is not intended to replace advice given to you by your health care provider. Make sure you discuss any questions you have with your health care provider. Document Revised: 06/11/2021 Document Reviewed: 06/11/2021 Elsevier Patient Education  [place].

## 2022-02-24 NOTE — Progress Notes (Signed)
Subjective:    Patient ID: Lauren Melton, female    DOB: 11/20/1986, 36 y.o.   MRN: [id number]  HPI Pt is a 36 yo female with history of migraines and tension headaches who presents to the clinic with more frequent headaches and associated neck and mid back pain for the last 2 weeks more noticeable. No new injury. Had MRI in 2020 due to hx of benign brain tumor from 2002. She she turns her head to the left she will have a shooting pain and then headache starts and sometimes a migraine. Less likely a migraine. Headache at least 2-3 times a week right now. No radiation into arms. No strength changes. No CP, palpitations, headaches, or vision changes.   She mentions still grieving her mother. She has not started counseling. She is having left sided chest wall pain for many months. Comes and goes but not associated with exertion. Feels occasional PVC. Been worked up before and everything was negative. Not worsening but persistent.   .. 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   No energy 08/27/2016   Class 3 severe obesity due to excess calories without serious comorbidity with body mass index (BMI) of 45.0 to 49.9 in adult ([place]) 08/27/2016   Vitamin D insufficiency 08/29/2016   Slow transit constipation 05/26/2018   Acute stress reaction 09/09/2018   New daily persistent headache 07/27/2019   Chronic tension-type headache, not intractable 08/02/2019   Snoring 08/02/2019   Non-restorative sleep 08/02/2019   Grief reaction 04/17/2020   Acute bilateral low back pain without sciatica 04/17/2020   Sore throat 56/21/3086   Complicated grief 57/84/6962   Migraine without aura and without status migrainosus, not intractable 03/19/2021   Depressed mood 03/19/2021   Acute pain of left knee 09/23/2021   Left-sided chest wall pain  02/24/2022   Palpitations 02/24/2022   Neck pain 02/24/2022   Frequent headaches 02/24/2022   Mid back pain 02/24/2022   Resolved Ambulatory Problems    Diagnosis Date Noted   No Resolved Ambulatory Problems   Past Medical History:  Diagnosis Date   HSV-2 seropositive    Hypertension       Review of Systems See HPI.     Objective:   Physical Exam Vitals reviewed.  Constitutional:      Appearance: She is well-developed. She is obese.  HENT:     Head: Normocephalic.     Mouth/Throat:     Mouth: Mucous membranes are moist.  Neck:     Comments: NROM of neck with pain with turning to the left Tenderness to palpation over cervical spine around C7.  Tight paraspinal muscles to palpation Cardiovascular:     Rate and Rhythm: Normal rate and regular rhythm.     Heart sounds: Normal heart sounds. No murmur heard. Pulmonary:     Effort: Pulmonary effort is normal.     Breath sounds: Normal breath sounds.     Comments: Left sided chest tightness over sternum and to the left.  Large breast Abdominal:     Palpations: Abdomen is soft.  Musculoskeletal:     Cervical back: Neck supple.  Lymphadenopathy:     Cervical: No cervical adenopathy.  Neurological:     Mental Status: She is alert and oriented to person, place, and time.     Cranial Nerves: No cranial nerve deficit or  dysarthria.     Gait: Gait normal.  Psychiatric:        Mood and Affect: Mood normal.   .. Depression screen [facility] 2/9 02/24/2022 09/23/2021 09/11/2020 07/27/2019 05/19/2018  Decreased Interest 1 0 0 0 0  Down, Depressed, Hopeless 1 0 1 0 0  PHQ - 2 Score 2 0 1 0 0  Altered sleeping 1 0 1 0 0  Tired, decreased energy 1 0 1 0 0  Change in appetite 1 0 1 0 0  Feeling bad or failure about yourself  0 0 0 0 0  Trouble concentrating 0 0 0 0 0  Moving slowly or fidgety/restless 0 0 0 0 0  Suicidal thoughts 0 0 1 0 0  PHQ-9 Score 5 0 5 0 0  Difficult doing work/chores - Not difficult at all Not difficult at all  Not difficult at all -   .. GAD 7 : Generalized Anxiety Score 02/24/2022 09/23/2021 09/11/2020 07/27/2019  Nervous, Anxious, on Edge 1 0 1 0  Control/stop worrying 0 0 1 0  Worry too much - different things 0 0 1 0  Trouble relaxing 1 0 1 0  Restless 0 0 0 0  Easily annoyed or irritable 0 0 1 0  Afraid - awful might happen 0 0 0 0  Total GAD 7 Score 2 0 5 0  Anxiety Difficulty Not difficult at all Not difficult at all Not difficult at all Not difficult at all            Assessment & Plan:  [name] KitchenMarland KitchenSamathia was seen today for headache.  Diagnoses and all orders for this visit:  Frequent headaches -     DG Cervical Spine Complete; Future -     baclofen (LIORESAL) 10 MG tablet; Take 1 tablet (10 mg total) by mouth 3 (three) times daily. -     meloxicam (MOBIC) 15 MG tablet; Take 1 tablet (15 mg total) by mouth daily. -     DG Thoracic Spine W/Swimmers; Future  Migraine without aura and without status migrainosus, not intractable -     rizatriptan (MAXALT) 5 MG tablet; Take 1 tablet (5 mg total) by mouth as needed for migraine. May repeat in 2 hours if needed -     baclofen (LIORESAL) 10 MG tablet; Take 1 tablet (10 mg total) by mouth 3 (three) times daily.  Mid back pain -     baclofen (LIORESAL) 10 MG tablet; Take 1 tablet (10 mg total) by mouth 3 (three) times daily. -     meloxicam (MOBIC) 15 MG tablet; Take 1 tablet (15 mg total) by mouth daily. -     DG Thoracic Spine W/Swimmers; Future  Neck pain -     DG Cervical Spine Complete; Future -     baclofen (LIORESAL) 10 MG tablet; Take 1 tablet (10 mg total) by mouth 3 (three) times daily. -     meloxicam (MOBIC) 15 MG tablet; Take 1 tablet (15 mg total) by mouth daily.  Left-sided chest wall pain -     meloxicam (MOBIC) 15 MG tablet; Take 1 tablet (15 mg total) by mouth daily.  Complicated grief  Palpitations   Headaches seem more tension related to likely degenerative disc disease etc.  Mobic to try for next 2  weeks Baclofen as needed and at bedtime Good supportive pillow Massage/chiropractor Tens unit/icy not patches/heating pad Follow up in 4-6 weeks.   Need to start counseling for grief.  CP do not sound  cardiac. Has been worked up in the past.  Large breast likely contribute to the left chest wall pain

## 2022-02-25 NOTE — Progress Notes (Signed)
You do have degenerative changes and some bone spurs in the cervical and thoracic spine that could be causing some pain and muscle tightness. If our treatment plan not helping consider appt with Dr. Darene Lamer discussed injections etc.

## 2022-07-16 ENCOUNTER — Telehealth: Payer: Self-pay | Admitting: General Practice

## 2022-07-16 NOTE — Telephone Encounter (Signed)
Transition Care Management Follow-up Telephone Call Date of discharge and from where: 07/15/22 from [facility] How have you been since you were released from the hospital? Patient is doing much better. Any questions or concerns? No  Items Reviewed: Did the pt receive and understand the discharge instructions provided? Yes  Medications obtained and verified? No  Other? No  Any new allergies since your discharge? No  Dietary orders reviewed? Yes Do you have support at home? Yes   Home Care and Equipment/Supplies: Were home health services ordered? no  Functional Questionnaire: (I = Independent and D = Dependent) ADLs: I  Bathing/Dressing- I  Meal Prep- I  Eating- I  Maintaining continence- I  Transferring/Ambulation- I  Managing Meds- I  Follow up appointments reviewed:  PCP Hospital f/u appt confirmed? Yes  Scheduled to see [name] PA  on 08/04/22 @ [facility] f/u appt confirmed? No   Are transportation arrangements needed? No  If their condition worsens, is the pt aware to call PCP or go to the Emergency Dept.? Yes Was the patient provided with contact information for the PCP's office or ED? Yes Was to pt encouraged to call back with questions or concerns? Yes

## 2022-08-04 ENCOUNTER — Inpatient Hospital Stay: Payer: Managed Care, Other (non HMO) | Admitting: Physician Assistant

## 2022-08-06 ENCOUNTER — Ambulatory Visit (INDEPENDENT_AMBULATORY_CARE_PROVIDER_SITE_OTHER): Payer: Managed Care, Other (non HMO)

## 2022-08-06 ENCOUNTER — Ambulatory Visit: Payer: Managed Care, Other (non HMO) | Admitting: Physician Assistant

## 2022-08-06 ENCOUNTER — Encounter: Payer: Self-pay | Admitting: Physician Assistant

## 2022-08-06 VITALS — BP 136/85 | HR 72 | Ht 67.0 in | Wt 311.0 lb

## 2022-08-06 DIAGNOSIS — R0789 Other chest pain: Secondary | ICD-10-CM

## 2022-08-06 DIAGNOSIS — R002 Palpitations: Secondary | ICD-10-CM | POA: Diagnosis not present

## 2022-08-06 DIAGNOSIS — Z6841 Body Mass Index (BMI) 40.0 and over, adult: Secondary | ICD-10-CM

## 2022-08-06 NOTE — Progress Notes (Signed)
Established Patient Office Visit  Subjective   Patient ID: Lauren Melton, female    DOB: 08-28-1986  Age: 36 y.o. MRN: [id number]  Chief Complaint  Patient presents with   Hospitalization Follow-up    HPI Pt is a 36 yo obese female who presents to the clinic to follow up after ED visit for CP on 07/15/2022. She woke up and had left sided chest pain that radiated into her back that continue to worsen.   See ED note:  Medical Decision Making Patient's vital signs are stable.  Blood work was unremarkable  Patient's EKG shows normal sinus rhythm no ST depression or elevation.  Chest x-ray shows chronic changes with scarring but no acute pathology  Since baseline troponin was 5 with a repeat delta after 1 hour of 0 and patient can be ruled out for an additional delta troponins at this time with PCP follow-up. Patient is a hear score of 1  Had been given IM anti-inflammatories with no improvement in her symptoms however after given GI cocktail patient states improvement in symptoms. Encourage patient to continue with this regiment and to follow-up with her PCP to ensure symptoms are resolving.   Pt has had some dull left sided CP since then. She also reports some "heart flutters" or palpitations for months off and on. Her palpitations and CP are not with exertion but most of the time stress induced. She continues on protonix but has noticed reflux from time to time especially after eating certain foods.     ROS See HPI.    Objective:     BP 136/85   Pulse 72   Ht '5\' 7"'$  (1.702 m)   Wt (!) 311 lb (141.1 kg)   SpO2 100%   BMI 48.71 kg/m  BP Readings from Last 3 Encounters:  08/06/22 136/85  02/24/22 (!) 141/75  09/23/21 133/76   Wt Readings from Last 3 Encounters:  08/06/22 (!) 311 lb (141.1 kg)  02/24/22 (!) 311 lb (141.1 kg)  09/23/21 (!) 309 lb (140.2 kg)      Physical Exam Constitutional:      Appearance: Normal appearance. She is obese.  HENT:     Head:  Normocephalic.  Neck:     Vascular: No carotid bruit.  Cardiovascular:     Rate and Rhythm: Normal rate and regular rhythm.     Pulses: Normal pulses.     Heart sounds: Normal heart sounds.  Pulmonary:     Effort: Pulmonary effort is normal.     Breath sounds: Normal breath sounds.     Comments: Left sided very mild discomfort to palpation.  Chest:     Chest wall: Tenderness present.  Abdominal:     General: There is no distension.     Palpations: Abdomen is soft. There is no mass.     Tenderness: There is no abdominal tenderness. There is no guarding or rebound.  Musculoskeletal:     Cervical back: Normal range of motion and neck supple. No tenderness.     Right lower leg: No edema.     Left lower leg: No edema.  Lymphadenopathy:     Cervical: No cervical adenopathy.  Neurological:     General: No focal deficit present.     Mental Status: She is alert and oriented to person, place, and time.  Psychiatric:        Mood and Affect: Mood normal.         Assessment & Plan:  [name] KitchenMarland KitchenSamathia was seen  today for hospitalization follow-up.  Diagnoses and all orders for this visit:  Class 3 severe obesity due to excess calories without serious comorbidity with body mass index (BMI) of 45.0 to 49.9 in adult [facility])  Left-sided chest wall pain  Palpitations   .[name] KitchenDiscussed low carb diet with 1500 calories and 80g of protein.  Exercising at least 150 minutes a week.  [employer] could be a Microbiologist.  Encouraged patient to consider saxenda to wegovy, contrave, or qysmia. HO given Follow up in 1 month  Reassured ED work up was good Ordered stress test and heart monitor Discussed palpitations and trigger Keep diary Avoid caffiene/alcohol HO given Continue on protonix for GERD Follow up in one month    [name], PA-C

## 2022-08-06 NOTE — Patient Instructions (Addendum)
Saxenda to [employer]   Will order stress test and heart monitor  Premature Ventricular Contraction  A premature ventricular contraction (PVC) is a common kind of irregular heartbeat (arrhythmia). These contractions are extra heartbeats that start in the ventricles of the heart and occur too early in the normal sequence. During the PVC, the heart's normal electrical pathway is not used, so the beat is shorter and less effective. In most cases, these contractions come and go and do not require treatment. What are the causes? Common causes of the condition include: Smoking. Drinking alcohol. Certain medicines. Some illegal drugs. Stress. Caffeine. Certain medical conditions can also cause PVCs: Heart failure. Heart attack, or coronary artery disease. Heart valve problems. Changes in minerals in the blood (electrolytes). Low blood oxygen levels or high carbon dioxide levels. In many cases, the cause of this condition is not known. What are the signs or symptoms? The main symptom of this condition is fast or skipped heartbeats (palpitations). Other symptoms include: Chest pain. Shortness of breath. Feeling tired. Dizziness. Difficulty exercising. In some cases, there are no symptoms. How is this diagnosed? This condition may be diagnosed based on: Your medical history. A physical exam. During the exam, the health care provider will check for irregular heartbeats. Tests, such as: An ECG (electrocardiogram) to monitor the electrical activity of your heart. An ambulatory cardiac monitor. This device records your heartbeats for 24 hours or more. Stress tests to see how exercise affects your heart rhythm and blood supply. An echocardiogram. This test uses sound waves (ultrasound) to produce an image of your heart. An electrophysiology study (EPS). This test checks for electrical problems in your heart. How is this treated? Treatment for this condition depends on any underlying conditions,  the type of PVCs that you are having, and how much the symptoms are interfering with your daily life. Possible treatments include: Avoiding things that cause premature contractions (triggers). These include caffeine and alcohol. Taking medicines if symptoms are severe or if the extra heartbeats are frequent. Getting treatment for underlying conditions that cause PVCs. Having an implantable cardioverter defibrillator (ICD), if you are at risk for a serious arrhythmia. The ICD is a small device that is inserted into your chest to monitor your heartbeat. When it senses an irregular heartbeat, it sends a shock to bring the heartbeat back to normal. Having a procedure to destroy the portion of the heart tissue that sends out abnormal signals (catheter ablation). In some cases, no treatment is required. Follow these instructions at home: Lifestyle Do not use any products that contain nicotine or tobacco, such as cigarettes, e-cigarettes, and chewing tobacco. If you need help quitting, ask your health care provider. Do not use illegal drugs. Exercise regularly. Ask your health care provider what type of exercise is safe for you. Try to get at least 7-9 hours of sleep each night, or as much as recommended by your health care provider. Find healthy ways to manage stress. Avoid stressful situations when possible. Alcohol use Do not drink alcohol if: Your health care provider tells you not to drink. You are pregnant, may be pregnant, or are planning to become pregnant. Alcohol triggers your episodes. If you drink alcohol: Limit how much you use to: 0-1 drink a day for women. 0-2 drinks a day for men. Be aware of how much alcohol is in your drink. In the [place] one drink equals one 12 oz bottle of beer (355 mL), one 5 oz glass of wine (148 mL), or one 1  oz glass of hard liquor (44 mL). General instructions Take over-the-counter and prescription medicines only as told by your health care provider. If  caffeine triggers episodes of PVC, do not eat, drink, or use anything with caffeine in it. Keep all follow-up visits as told by your health care provider. This is important. Contact a health care provider if you: Feel palpitations. Get help right away if you: Have chest pain. Have shortness of breath. Have sweating for no reason. Have nausea and vomiting. Become light-headed or you faint. Summary A premature ventricular contraction (PVC) is a common kind of irregular heartbeat (arrhythmia). In most cases, these contractions come and go and do not require treatment. You may need to wear an ambulatory cardiac monitor. This records your heartbeats for 24 hours or more. Treatment depends on any underlying conditions, the type of PVCs that you are having, and how much the symptoms are interfering with your daily life. This information is not intended to replace advice given to you by your health care provider. Make sure you discuss any questions you have with your health care provider. Document Revised: 03/20/2022 Document Reviewed: 05/14/2021 Elsevier Patient Education  [place].

## 2022-08-06 NOTE — Progress Notes (Unsigned)
Enrolled for [employer] to mail a ZIO XT long term holter monitor to the patients address on file.   DOD to read. 

## 2022-08-12 ENCOUNTER — Encounter: Payer: Self-pay | Admitting: Physician Assistant

## 2022-08-12 DIAGNOSIS — R002 Palpitations: Secondary | ICD-10-CM

## 2022-08-12 DIAGNOSIS — R0789 Other chest pain: Secondary | ICD-10-CM | POA: Diagnosis not present

## 2022-08-12 DIAGNOSIS — Z6841 Body Mass Index (BMI) 40.0 and over, adult: Secondary | ICD-10-CM

## 2022-08-29 ENCOUNTER — Telehealth (HOSPITAL_COMMUNITY): Payer: Self-pay | Admitting: *Deleted

## 2022-08-29 NOTE — Telephone Encounter (Signed)
Close encounter 

## 2022-09-02 ENCOUNTER — Ambulatory Visit (HOSPITAL_COMMUNITY)
Admission: RE | Admit: 2022-09-02 | Payer: Managed Care, Other (non HMO) | Source: Ambulatory Visit | Attending: Physician Assistant | Admitting: Physician Assistant

## 2022-09-03 NOTE — Progress Notes (Signed)
Sam,   Your zio patch results showed mostly sinus rhythm with one run(lasting 4 beats) of supraventricular tachycardia. Do you continue to feel symptomatic regularly? I don't know that I would start medication for this frequency. I don't think it would be a bad idea to consult cardiology. Thoughts?

## 2022-09-05 ENCOUNTER — Telehealth (HOSPITAL_COMMUNITY): Payer: Self-pay | Admitting: *Deleted

## 2022-09-05 NOTE — Telephone Encounter (Signed)
Close encounter 

## 2022-09-09 ENCOUNTER — Ambulatory Visit (HOSPITAL_COMMUNITY)
Admission: RE | Admit: 2022-09-09 | Payer: Managed Care, Other (non HMO) | Source: Ambulatory Visit | Attending: Physician Assistant | Admitting: Physician Assistant

## 2022-09-23 ENCOUNTER — Ambulatory Visit (INDEPENDENT_AMBULATORY_CARE_PROVIDER_SITE_OTHER): Payer: Managed Care, Other (non HMO) | Admitting: Physician Assistant

## 2022-09-23 ENCOUNTER — Other Ambulatory Visit (HOSPITAL_COMMUNITY)
Admission: RE | Admit: 2022-09-23 | Discharge: 2022-09-23 | Disposition: A | Discharge status: Home / Self Care | Payer: Managed Care, Other (non HMO) | Source: Ambulatory Visit | Attending: Physician Assistant | Admitting: Physician Assistant

## 2022-09-23 ENCOUNTER — Encounter: Payer: Self-pay | Admitting: Physician Assistant

## 2022-09-23 VITALS — BP 134/82 | HR 82 | Ht 67.0 in | Wt 314.0 lb

## 2022-09-23 DIAGNOSIS — R8290 Unspecified abnormal findings in urine: Secondary | ICD-10-CM | POA: Diagnosis not present

## 2022-09-23 DIAGNOSIS — N644 Mastodynia: Secondary | ICD-10-CM

## 2022-09-23 DIAGNOSIS — Z6841 Body Mass Index (BMI) 40.0 and over, adult: Secondary | ICD-10-CM

## 2022-09-23 DIAGNOSIS — Z124 Encounter for screening for malignant neoplasm of cervix: Secondary | ICD-10-CM

## 2022-09-23 DIAGNOSIS — Z0000 Encounter for general adult medical examination without abnormal findings: Secondary | ICD-10-CM

## 2022-09-23 DIAGNOSIS — E559 Vitamin D deficiency, unspecified: Secondary | ICD-10-CM

## 2022-09-23 DIAGNOSIS — I10 Essential (primary) hypertension: Secondary | ICD-10-CM | POA: Diagnosis not present

## 2022-09-23 DIAGNOSIS — N898 Other specified noninflammatory disorders of vagina: Secondary | ICD-10-CM | POA: Insufficient documentation

## 2022-09-23 DIAGNOSIS — G43009 Migraine without aura, not intractable, without status migrainosus: Secondary | ICD-10-CM

## 2022-09-23 DIAGNOSIS — Z13220 Encounter for screening for lipoid disorders: Secondary | ICD-10-CM

## 2022-09-23 DIAGNOSIS — N946 Dysmenorrhea, unspecified: Secondary | ICD-10-CM

## 2022-09-23 DIAGNOSIS — Z1329 Encounter for screening for other suspected endocrine disorder: Secondary | ICD-10-CM | POA: Diagnosis not present

## 2022-09-23 DIAGNOSIS — E66813 Obesity, class 3: Secondary | ICD-10-CM

## 2022-09-23 LAB — POCT URINALYSIS DIP (CLINITEK)
Bilirubin, UA: NEGATIVE
Blood, UA: NEGATIVE
Glucose, UA: NEGATIVE mg/dL
Ketones, POC UA: NEGATIVE mg/dL
Nitrite, UA: NEGATIVE
POC PROTEIN,UA: NEGATIVE
Spec Grav, UA: 1.02 (ref 1.010–1.025)
Urobilinogen, UA: 0.2 E.U./dL
pH, UA: 7 (ref 5.0–8.0)

## 2022-09-23 MED ORDER — RIZATRIPTAN BENZOATE 5 MG PO TABS: 5.0000 mg | ORAL_TABLET | ORAL | 5 refills | Status: DC | PRN

## 2022-09-23 MED ORDER — BISOPROLOL-HYDROCHLOROTHIAZIDE 5-6.25 MG PO TABS: 1.0000 | ORAL_TABLET | Freq: Every day | ORAL | 3 refills | Status: DC

## 2022-09-23 NOTE — Patient Instructions (Signed)

## 2022-09-23 NOTE — Progress Notes (Signed)
Subjective:     Lauren Melton is a 36 y.o. female and is here for a comprehensive physical exam. The patient reports problems - see below .  Pt is having heavy painful periods and breast tenderness. She is not on hormones. She would like them checked. She is having a lot of breast tenderness as well. Left more than right.   She has recurrent BV. Current vaginal discharge with cloudy urine. She wants everything checked today. Pap one year ago and normal.    Social History   Socioeconomic History   Marital status: Single    Spouse name: Not on file   Number of children: Not on file   Years of education: Not on file   Highest education level: Not on file  Occupational History   Not on file  Tobacco Use   Smoking status: Never   Smokeless tobacco: Never  Substance and Sexual Activity   Alcohol use: No   Drug use: No   Sexual activity: Yes  Other Topics Concern   Not on file  Social History Narrative   Not on file   Social Determinants of Health   Financial Resource Strain: Not on file  Food Insecurity: Not on file  Transportation Needs: Not on file  Physical Activity: Not on file  Stress: Not on file  Social Connections: Not on file  Intimate Partner Violence: Not on file   Health Maintenance  Topic Date Due   INFLUENZA VACCINE  03/29/2023 (Originally 07/29/2022)   PAP SMEAR-Modifier  09/23/2025   TETANUS/TDAP  09/28/2025   Hepatitis C Screening  Completed   HIV Screening  Completed   HPV VACCINES  Aged Out   COVID-19 Vaccine  Discontinued    The following portions of the patient's history were reviewed and updated as appropriate: allergies, current medications, past family history, past medical history, past social history, past surgical history, and problem list.  Review of Systems Pertinent items noted in HPI and remainder of comprehensive ROS otherwise negative.   Objective:    BP 134/82   Pulse 82   Ht '5\' 7"'$  (1.702 m)   Wt (!) 314 lb (142.4 kg)    SpO2 99%   BMI 49.18 kg/m  General appearance: alert, cooperative, appears stated age, and morbidly obese Head: Normocephalic, without obvious abnormality, atraumatic Eyes: conjunctivae/corneas clear. PERRL, EOM's intact. Fundi benign. Ears: normal TM's and external ear canals both ears Nose: Nares normal. Septum midline. Mucosa normal. No drainage or sinus tenderness. Throat: lips, mucosa, and tongue normal; teeth and gums normal Neck: no adenopathy, no carotid bruit, no JVD, supple, symmetrical, trachea midline, and thyroid not enlarged, symmetric, no tenderness/mass/nodules Back: symmetric, no curvature. ROM normal. No CVA tenderness. Lungs: clear to auscultation bilaterally Breasts:  bilateral breast tenderness Heart: regular rate and rhythm, S1, S2 normal, no murmur, click, rub or gallop Abdomen: soft, non-tender; bowel sounds normal; no masses,  no organomegaly Pelvic: cervix normal in appearance, external genitalia normal, no adnexal masses or tenderness, no cervical motion tenderness, rectovaginal septum normal, uterus normal size, shape, and consistency, and vagina normal without discharge Extremities: extremities normal, atraumatic, no cyanosis or edema Pulses: 2+ and symmetric Skin: Skin color, texture, turgor normal. No rashes or lesions Lymph nodes: Cervical, supraclavicular, and axillary nodes normal. Neurologic: Grossly normal    Assessment:    Healthy female exam.      Plan:    [name] KitchenMarland KitchenSamathia was seen today for annual exam.  Diagnoses and all orders for this visit:  Routine physical  examination -     TSH -     Lipid Panel w/reflex Direct LDL -     COMPLETE METABOLIC PANEL WITH GFR -     CBC with Differential/Platelet -     Vitamin D (25 hydroxy) -     Cytology - PAP  Essential hypertension -     COMPLETE METABOLIC PANEL WITH GFR -     bisoprolol-hydrochlorothiazide (ZIAC) 5-6.25 MG tablet; Take 1 tablet by mouth daily.  Vitamin D insufficiency -     Vitamin D  (25 hydroxy)  Thyroid disorder screen -     TSH  Lipid screening -     Lipid Panel w/reflex Direct LDL  Migraine without aura and without status migrainosus, not intractable -     rizatriptan (MAXALT) 5 MG tablet; Take 1 tablet (5 mg total) by mouth as needed for migraine. May repeat in 2 hours if needed  Vagina itching -     WET PREP FOR [place], YEAST, CLUE -     Cytology - PAP  Vaginal discharge -     WET PREP FOR [place], YEAST, CLUE -     Cytology - PAP  Class 3 severe obesity due to excess calories without serious comorbidity with body mass index (BMI) of 45.0 to 49.9 in adult (HCC)  Cloudy urine -     POCT URINALYSIS DIP (CLINITEK) -     Urine Culture  Breast tenderness -     Progesterone -     CP Testosterone, BIO-Female/Children -     Estradiol -     FSH/LH -     CP Testosterone, BIO-Female/Children -     FSH/LH -     Progesterone -     Estradiol  Papanicolaou smear -     FSH/LH -     Cytology - PAP  Dysmenorrhea -     FSH/LH -     CP Testosterone, BIO-Female/Children -     FSH/LH -     Progesterone -     Estradiol   .[name] Kitchen Discussed 150 minutes of exercise a week.  Encouraged vitamin D 1000 units and Calcium '1300mg'$  or 4 servings of dairy a day.  Fasting labs ordered PHQ improving pt is off lexapro Hormones to check for breast tenderness and dysmenorrhea Pap done today STI testing with wet prep Vitals look good. Discussed vaccines Follow up as needed in 6 months  .[name] KitchenDiscussed low carb diet with 1500 calories and 80g of protein.  Exercising at least 150 minutes a week.  [employer] Pal could be a Microbiologist.   See After Visit Summary for Counseling Recommendations

## 2022-09-24 ENCOUNTER — Encounter: Payer: Self-pay | Admitting: Physician Assistant

## 2022-09-24 ENCOUNTER — Other Ambulatory Visit: Payer: Self-pay | Admitting: Physician Assistant

## 2022-09-24 LAB — WET PREP FOR TRICH, YEAST, CLUE
MICRO NUMBER:: 13972541
Specimen Quality: ADEQUATE

## 2022-09-24 MED ORDER — METRONIDAZOLE 500 MG PO TABS: 500.0000 mg | ORAL_TABLET | Freq: Two times a day (BID) | ORAL | 0 refills | Status: AC

## 2022-09-24 NOTE — Progress Notes (Signed)
Clue cells present-bacterial vaginosis will send metronidazole.

## 2022-09-25 LAB — URINE CULTURE
MICRO NUMBER:: 13972528
SPECIMEN QUALITY:: ADEQUATE

## 2022-09-26 ENCOUNTER — Encounter: Payer: Self-pay | Admitting: Physician Assistant

## 2022-09-26 MED ORDER — FLUCONAZOLE 150 MG PO TABS: 150.0000 mg | ORAL_TABLET | Freq: Once | ORAL | 1 refills | Status: AC

## 2022-09-26 NOTE — Progress Notes (Signed)
No significant bacteria found in urine.

## 2022-09-28 LAB — CP TESTOSTERONE, BIO-FEMALE/CHILDREN
Albumin: 4.4 g/dL (ref 3.6–5.1)
Sex Hormone Binding: 20.7 nmol/L (ref 17–124)
TESTOSTERONE, BIOAVAILABLE: 7.6 ng/dL (ref 0.5–8.5)
Testosterone, Free: 3.8 pg/mL (ref 0.2–5.0)
Testosterone, Total, LC-MS-MS: 23 ng/dL (ref 2–45)

## 2022-09-28 LAB — COMPLETE METABOLIC PANEL WITH GFR
AG Ratio: 1.4 (calc) (ref 1.0–2.5)
ALT: 13 U/L (ref 6–29)
AST: 10 U/L (ref 10–30)
Albumin: 4.3 g/dL (ref 3.6–5.1)
Alkaline phosphatase (APISO): 63 U/L (ref 31–125)
BUN: 11 mg/dL (ref 7–25)
CO2: 27 mmol/L (ref 20–32)
Calcium: 9.5 mg/dL (ref 8.6–10.2)
Chloride: 103 mmol/L (ref 98–110)
Creat: 0.65 mg/dL (ref 0.50–0.97)
Globulin: 3 g/dL (calc) (ref 1.9–3.7)
Glucose, Bld: 92 mg/dL (ref 65–99)
Potassium: 4.5 mmol/L (ref 3.5–5.3)
Sodium: 137 mmol/L (ref 135–146)
Total Bilirubin: 0.4 mg/dL (ref 0.2–1.2)
Total Protein: 7.3 g/dL (ref 6.1–8.1)
eGFR: 116 mL/min/{1.73_m2} (ref >=60)

## 2022-09-28 LAB — CBC WITH DIFFERENTIAL/PLATELET
Absolute Monocytes: 467 cells/uL (ref 200–950)
Basophils Absolute: 73 cells/uL (ref 0–200)
Basophils Relative: 1 %
Eosinophils Absolute: 219 cells/uL (ref 15–500)
Eosinophils Relative: 3 %
HCT: 34.1 % — ABNORMAL LOW (ref 35.0–45.0)
Hemoglobin: 11.5 g/dL — ABNORMAL LOW (ref 11.7–15.5)
Lymphs Abs: 2825 cells/uL (ref 850–3900)
MCH: 28.8 pg (ref 27.0–33.0)
MCHC: 33.7 g/dL (ref 32.0–36.0)
MCV: 85.3 fL (ref 80.0–100.0)
MPV: 9.6 fL (ref 7.5–12.5)
Monocytes Relative: 6.4 %
Neutro Abs: 3716 cells/uL (ref 1500–7800)
Neutrophils Relative %: 50.9 %
Platelets: 378 10*3/uL (ref 140–400)
RBC: 4 10*6/uL (ref 3.80–5.10)
RDW: 14.8 % (ref 11.0–15.0)
Total Lymphocyte: 38.7 %
WBC: 7.3 10*3/uL (ref 3.8–10.8)

## 2022-09-28 LAB — LIPID PANEL W/REFLEX DIRECT LDL
Cholesterol: 213 mg/dL — ABNORMAL HIGH (ref <200)
HDL: 40 mg/dL — ABNORMAL LOW (ref >=50)
LDL Cholesterol (Calc): 151 mg/dL (calc) — ABNORMAL HIGH
Non-HDL Cholesterol (Calc): 173 mg/dL (calc) — ABNORMAL HIGH (ref <130)
Total CHOL/HDL Ratio: 5.3 (calc) — ABNORMAL HIGH (ref <5.0)
Triglycerides: 108 mg/dL (ref <150)

## 2022-09-28 LAB — FSH/LH
FSH: 4.4 m[IU]/mL
LH: 8.1 m[IU]/mL

## 2022-09-28 LAB — VITAMIN D 25 HYDROXY (VIT D DEFICIENCY, FRACTURES): Vit D, 25-Hydroxy: 23 ng/mL — ABNORMAL LOW (ref 30–100)

## 2022-09-28 LAB — ESTRADIOL: Estradiol: 106 pg/mL

## 2022-09-28 LAB — TSH: TSH: 1.29 mIU/L

## 2022-09-28 LAB — PROGESTERONE: Progesterone: 10.1 ng/mL

## 2022-09-29 LAB — CYTOLOGY - PAP
Chlamydia: NEGATIVE
Comment: NEGATIVE
Comment: NEGATIVE
Comment: NORMAL
Diagnosis: NEGATIVE
High risk HPV: NEGATIVE
Neisseria Gonorrhea: NEGATIVE

## 2022-09-29 NOTE — Progress Notes (Signed)
Sam,   Vitamin D not to goal. D3 2000 units daily.  Thyroid looks good.  Kidney, liver, glucose looks good.  You are not in menopause or close. Hormones look good.  Slightly low hemoglobin. Iron supplement daily.  Cholesterol is up.  LDL not to goal.  Working on low fat diet and exercise 174mnutes a week.

## 2022-09-30 ENCOUNTER — Encounter: Payer: Self-pay | Admitting: Physician Assistant

## 2022-09-30 DIAGNOSIS — N946 Dysmenorrhea, unspecified: Secondary | ICD-10-CM | POA: Insufficient documentation

## 2022-09-30 DIAGNOSIS — N644 Mastodynia: Secondary | ICD-10-CM | POA: Insufficient documentation

## 2022-09-30 DIAGNOSIS — N898 Other specified noninflammatory disorders of vagina: Secondary | ICD-10-CM | POA: Insufficient documentation

## 2022-09-30 DIAGNOSIS — R8290 Unspecified abnormal findings in urine: Secondary | ICD-10-CM | POA: Insufficient documentation

## 2022-09-30 MED ORDER — METRONIDAZOLE 0.75 % VA GEL: 1.0000 | Freq: Every day | VAGINAL | 0 refills | Status: DC

## 2022-09-30 NOTE — Progress Notes (Signed)
Negative for STIs. Negative for HPV. No abnormal cells.

## 2022-10-02 ENCOUNTER — Telehealth (HOSPITAL_COMMUNITY): Payer: Self-pay | Admitting: *Deleted

## 2022-10-02 NOTE — Telephone Encounter (Signed)
Close encounter 

## 2022-10-03 ENCOUNTER — Ambulatory Visit (HOSPITAL_COMMUNITY)
Admission: RE | Admit: 2022-10-03 | Discharge: 2022-10-03 | Disposition: A | Discharge status: Home / Self Care | Payer: Managed Care, Other (non HMO) | Source: Ambulatory Visit | Attending: Cardiovascular Disease | Admitting: Cardiovascular Disease

## 2022-10-03 DIAGNOSIS — R002 Palpitations: Secondary | ICD-10-CM

## 2022-10-03 DIAGNOSIS — R0789 Other chest pain: Secondary | ICD-10-CM

## 2022-10-03 DIAGNOSIS — E66813 Obesity, class 3: Secondary | ICD-10-CM

## 2022-10-03 DIAGNOSIS — Z6841 Body Mass Index (BMI) 40.0 and over, adult: Secondary | ICD-10-CM | POA: Insufficient documentation

## 2022-10-03 LAB — EXERCISE TOLERANCE TEST
Angina Index: 0
Duke Treadmill Score: 7
Estimated workload: 8.2
Exercise duration (min): 6 min
Exercise duration (sec): 50 s
MPHR: 183 {beats}/min
Peak HR: 169 {beats}/min
Percent HR: 92 %
Rest HR: 82 {beats}/min
ST Depression (mm): 0 mm

## 2022-10-06 NOTE — Progress Notes (Signed)
GREAT news. Normal stress test!

## 2022-10-15 ENCOUNTER — Ambulatory Visit: Payer: Managed Care, Other (non HMO) | Admitting: Physician Assistant

## 2022-10-15 VITALS — BP 137/72 | HR 80

## 2022-10-15 DIAGNOSIS — R300 Dysuria: Secondary | ICD-10-CM | POA: Diagnosis not present

## 2022-10-15 DIAGNOSIS — R102 Pelvic and perineal pain: Secondary | ICD-10-CM

## 2022-10-15 DIAGNOSIS — N898 Other specified noninflammatory disorders of vagina: Secondary | ICD-10-CM | POA: Diagnosis not present

## 2022-10-15 LAB — POCT URINALYSIS DIP (CLINITEK)
Bilirubin, UA: NEGATIVE
Blood, UA: NEGATIVE
Glucose, UA: NEGATIVE mg/dL
Ketones, POC UA: NEGATIVE mg/dL
Leukocytes, UA: NEGATIVE
Nitrite, UA: NEGATIVE
POC PROTEIN,UA: NEGATIVE
Spec Grav, UA: 1.015 (ref 1.010–1.025)
Urobilinogen, UA: 0.2 E.U./dL
pH, UA: 6.5 (ref 5.0–8.0)

## 2022-10-15 NOTE — Progress Notes (Signed)
Acute Office Visit  Subjective:     Patient ID: Lauren Melton, female    DOB: 26-Jan-1986, 36 y.o.   MRN: [id number]  Chief Complaint  Patient presents with   Follow-up    Vag discharge, pelvic pain, low back pain    HPI Patient is in today for recurrent BV, vaginal discharge and pelvic pain and pressure. She had normal pap and negative STD on 09/23/2022. She had normal urine culture at that time as well but she did have BV. She was treated with metronidazole. Her vaginal discharge and odor did improve but she feels like it could be coming back. She has ongoing suprapubic pain and pressure. No fever, chills, nausea. She has not been sexually active since her pap 08/2022.  .. 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   No energy 08/27/2016   Class 3 severe obesity due to excess calories without serious comorbidity with body mass index (BMI) of 45.0 to 49.9 in adult ([place]) 08/27/2016   Vitamin D insufficiency 08/29/2016   Slow transit constipation 05/26/2018   Acute stress reaction 09/09/2018   New daily persistent headache 07/27/2019   Chronic tension-type headache, not intractable 08/02/2019   Snoring 08/02/2019   Non-restorative sleep 08/02/2019   Grief reaction 04/17/2020   Acute bilateral low back pain without sciatica 04/17/2020   Sore throat 29/51/8841   Complicated grief 66/05/3015   Migraine without aura and without status migrainosus, not intractable 03/19/2021   Depressed mood 03/19/2021   Acute pain of left knee 09/23/2021   Left-sided chest wall pain 02/24/2022   Palpitations 02/24/2022   Neck pain 02/24/2022   Frequent headaches 02/24/2022   Mid back pain 02/24/2022   Breast tenderness 09/30/2022   Dysmenorrhea 09/30/2022   Cloudy urine 09/30/2022   Vaginal discharge 09/30/2022   Suprapubic pain  10/27/2022   Dysuria 10/27/2022   Resolved Ambulatory Problems    Diagnosis Date Noted   No Resolved Ambulatory Problems   Past Medical History:  Diagnosis Date   HSV-2 seropositive    Hypertension      ROS  See HPI.     Objective:    BP 137/72   Pulse 80   SpO2 100%  BP Readings from Last 3 Encounters:  10/15/22 137/72  09/23/22 134/82  08/06/22 136/85   Wt Readings from Last 3 Encounters:  09/23/22 (!) 314 lb (142.4 kg)  08/06/22 (!) 311 lb (141.1 kg)  02/24/22 (!) 311 lb (141.1 kg)      Physical Exam Vitals reviewed.  Constitutional:      Appearance: Normal appearance. She is obese.  HENT:     Head: Normocephalic.  Cardiovascular:     Rate and Rhythm: Normal rate.     Pulses: Normal pulses.     Heart sounds: Normal heart sounds.  Pulmonary:     Effort: Pulmonary effort is normal.     Breath sounds: Normal breath sounds.  Abdominal:     General: Bowel sounds are normal. There is no distension.     Palpations: Abdomen is soft. There is no mass.     Tenderness: There is no abdominal tenderness. There is no right CVA tenderness, left CVA tenderness, guarding or rebound.     Hernia: No hernia is present.  Musculoskeletal:     Right lower leg: No edema.     Left  lower leg: No edema.  Neurological:     General: No focal deficit present.     Mental Status: She is alert and oriented to person, place, and time.  Psychiatric:        Mood and Affect: Mood normal.    .. Results for orders placed or performed in visit on 10/15/22  Urine Culture   Specimen: Urine  Result Value Ref Range   MICRO NUMBER: [id number]    SPECIMEN QUALITY: Adequate    Sample Source URINE    STATUS: FINAL    Result: No Growth   SureSwab Advanced Vaginitis Plus,TMA   Specimen: Urine  Result Value Ref Range   SURESWAB(R) ADV BACTERIAL VAGINOSIS(BV),TMA NEGATIVE NEGATIVE   CANDIDA SPECIES NOT DETECTED NOT DETECTED   Candida glabrata NOT DETECTED NOT DETECTED   TRICHOMONAS  VAGINALIS (TV),TMA NOT DETECTED NOT DETECTED   C. trachomatis RNA, TMA NOT DETECTED NOT DETECTED   N. gonorrhoeae RNA, TMA NOT DETECTED NOT DETECTED  POCT URINALYSIS DIP (CLINITEK)  Result Value Ref Range   Color, UA yellow yellow   Clarity, UA clear clear   Glucose, UA negative negative mg/dL   Bilirubin, UA negative negative   Ketones, POC UA negative negative mg/dL   Spec Grav, UA 1.015 1.010 - 1.025   Blood, UA negative negative   pH, UA 6.5 5.0 - 8.0   POC PROTEIN,UA negative negative, trace   Urobilinogen, UA 0.2 0.2 or 1.0 E.U./dL   Nitrite, UA Negative Negative   Leukocytes, UA Negative Negative        Assessment & Plan:  [name] KitchenMarland KitchenSamathia was seen today for follow-up.  Diagnoses and all orders for this visit:  Suprapubic pain -     US Pelvic Complete With Transvaginal; Future -     Urine Culture -     SureSwab Advanced Vaginitis Plus,TMA  Vaginal discharge -     US Pelvic Complete With Transvaginal; Future -     Urine Culture -     SureSwab Advanced Vaginitis Plus,TMA  Dysuria -     US Pelvic Complete With Transvaginal; Future -     Urine Culture -     SureSwab Advanced Vaginitis Plus,TMA -     POCT URINALYSIS DIP (CLINITEK)   UA negative with no concerns Will culture to confirm no bacteria Will order sure swab to make sure BV/yeast has cleared Will get pelvic ultrasound for pelvic pressure Discussed BV prevention with vaginal probiotics and boric acid Unclear etiology of symptoms [name], PA-C

## 2022-10-15 NOTE — Patient Instructions (Addendum)
Will get sure swab results Will get vaginal u/s Will get urine culture Discussed starting probiotic for vaginal health  Bacterial Vaginosis  Bacterial vaginosis is an infection that occurs when the normal balance of bacteria in the vagina changes. This change is caused by an overgrowth of certain bacteria in the vagina. Bacterial vaginosis is the most common vaginal infection among females aged 37 to 57 years. This condition increases the risk of sexually transmitted infections (STIs). Treatment can help reduce this risk. Treatment is very important for pregnant women because this condition can cause babies to be born early (prematurely) or at a low birth weight. What are the causes? This condition is caused by an increase in harmful bacteria that are normally present in small amounts in the vagina. However, the exact reason this condition develops is not known. You cannot get bacterial vaginosis from toilet seats, bedding, swimming pools, or contact with objects around you. What increases the risk? The following factors may make you more likely to develop this condition: Having a new sexual partner or multiple sexual partners, or having unprotected sex. Douching. Having an intrauterine device (IUD). Smoking. Abusing drugs and alcohol. This may lead to riskier sexual behavior. Taking certain antibiotic medicines. Being pregnant. What are the signs or symptoms? Some women with this condition have no symptoms. Symptoms may include: Pearline Cables or white vaginal discharge. The discharge can be watery or foamy. A fish-like odor with discharge, especially after sex or during menstruation. Itching in and around the vagina. Burning or pain with urination. How is this diagnosed? This condition is diagnosed based on: Your medical history. A physical exam of the vagina. Checking a sample of vaginal fluid for harmful bacteria or abnormal cells. How is this treated? This condition is treated with  antibiotic medicines. These may be given as a pill, a vaginal cream, or a medicine that is put into the vagina (suppository). If the condition comes back after treatment, a second round of antibiotics may be needed. Follow these instructions at home: Medicines Take or apply over-the-counter and prescription medicines only as told by your health care provider. Take or apply your antibiotic medicine as told by your health care provider. Do not stop using the antibiotic even if you start to feel better. General instructions If you have a female sexual partner, tell her that you have a vaginal infection. She should follow up with her health care provider. If you have a female sexual partner, he does not need treatment. Avoid sexual activity until you finish treatment. Drink enough fluid to keep your urine pale yellow. Keep the area around your vagina and rectum clean. Wash the area daily with warm water. Wipe yourself from front to back after using the toilet. If you are breastfeeding, talk to your health care provider about continuing breastfeeding during treatment. Keep all follow-up visits. This is important. How is this prevented? Self-care Do not douche. Wash the outside of your vagina with warm water only. Wear cotton or cotton-lined underwear. Avoid wearing tight pants and pantyhose, especially during the summer. Safe sex Use protection when having sex. This includes: Using condoms. Using dental dams. This is a thin layer of a material made of latex or polyurethane that protects the mouth during oral sex. Limit the number of sexual partners. To help prevent bacterial vaginosis, it is best to have sex with just one partner (monogamous relationship). Make sure you and your sexual partner are tested for STIs. Drugs and alcohol Do not use any products that  contain nicotine or tobacco. These products include cigarettes, chewing tobacco, and vaping devices, such as e-cigarettes. If you need  help quitting, ask your health care provider. Do not use drugs. Do not drink alcohol if: Your health care provider tells you not to do this. You are pregnant, may be pregnant, or are planning to become pregnant. If you drink alcohol: Limit how much you have to 0-1 drink a day. Be aware of how much alcohol is in your drink. In the [place] one drink equals one 12 oz bottle of beer (355 mL), one 5 oz glass of wine (148 mL), or one 1 oz glass of hard liquor (44 mL). Where to find more information Centers for Disease Control and Prevention: http://www.wolf.info/ American Sexual Health Association (ASHA): www.ashastd.org U.S. Department of Health and Financial controller, Office on Women's Health: [place].tn Contact a health care provider if: Your symptoms do not improve, even after treatment. You have more discharge or pain when urinating. You have a fever or chills. You have pain in your abdomen or pelvis. You have pain during sex. You have vaginal bleeding between menstrual periods. Summary Bacterial vaginosis is a vaginal infection that occurs when the normal balance of bacteria in the vagina changes. It results from an overgrowth of certain bacteria. This condition increases the risk of sexually transmitted infections (STIs). Getting treated can help reduce this risk. Treatment is very important for pregnant women because this condition can cause babies to be born early (prematurely) or at low birth weight. This condition is treated with antibiotic medicines. These may be given as a pill, a vaginal cream, or a medicine that is put into the vagina (suppository). This information is not intended to replace advice given to you by your health care provider. Make sure you discuss any questions you have with your health care provider. Document Revised: 06/14/2020 Document Reviewed: 06/14/2020 Elsevier Patient Education  [place].

## 2022-10-15 NOTE — Progress Notes (Signed)
Normal UA. Culture sent.

## 2022-10-17 ENCOUNTER — Encounter: Payer: Self-pay | Admitting: Physician Assistant

## 2022-10-17 LAB — SURESWAB® ADVANCED VAGINITIS PLUS,TMA
C. trachomatis RNA, TMA: NOT DETECTED
CANDIDA SPECIES: NOT DETECTED
Candida glabrata: NOT DETECTED
N. gonorrhoeae RNA, TMA: NOT DETECTED
SURESWAB(R) ADV BACTERIAL VAGINOSIS(BV),TMA: NEGATIVE
TRICHOMONAS VAGINALIS (TV),TMA: NOT DETECTED

## 2022-10-17 LAB — URINE CULTURE
MICRO NUMBER:: 14072064
Result:: NO GROWTH
SPECIMEN QUALITY:: ADEQUATE

## 2022-10-17 NOTE — Progress Notes (Signed)
No bacterial vaginosis. No yeast.

## 2022-10-21 ENCOUNTER — Ambulatory Visit (INDEPENDENT_AMBULATORY_CARE_PROVIDER_SITE_OTHER): Payer: Managed Care, Other (non HMO)

## 2022-10-21 DIAGNOSIS — N898 Other specified noninflammatory disorders of vagina: Secondary | ICD-10-CM

## 2022-10-21 DIAGNOSIS — R102 Pelvic and perineal pain: Secondary | ICD-10-CM | POA: Diagnosis not present

## 2022-10-21 DIAGNOSIS — R300 Dysuria: Secondary | ICD-10-CM | POA: Diagnosis not present

## 2022-10-22 ENCOUNTER — Encounter: Payer: Self-pay | Admitting: Physician Assistant

## 2022-10-22 DIAGNOSIS — D259 Leiomyoma of uterus, unspecified: Secondary | ICD-10-CM

## 2022-10-22 DIAGNOSIS — R102 Pelvic and perineal pain: Secondary | ICD-10-CM

## 2022-10-22 NOTE — Progress Notes (Signed)
28m fibroid but no other abnormal findings.

## 2022-10-27 ENCOUNTER — Encounter: Payer: Self-pay | Admitting: Physician Assistant

## 2022-10-27 DIAGNOSIS — R1024 Suprapubic pain: Secondary | ICD-10-CM | POA: Insufficient documentation

## 2022-10-27 DIAGNOSIS — R300 Dysuria: Secondary | ICD-10-CM | POA: Insufficient documentation

## 2022-10-27 DIAGNOSIS — R102 Pelvic and perineal pain: Secondary | ICD-10-CM | POA: Insufficient documentation

## 2023-01-05 ENCOUNTER — Ambulatory Visit: Payer: Managed Care, Other (non HMO) | Admitting: Physician Assistant

## 2023-01-05 VITALS — BP 138/80 | HR 70 | Temp 98.7°F | Ht 67.0 in | Wt 321.0 lb

## 2023-01-05 DIAGNOSIS — Z6841 Body Mass Index (BMI) 40.0 and over, adult: Secondary | ICD-10-CM

## 2023-01-05 DIAGNOSIS — N898 Other specified noninflammatory disorders of vagina: Secondary | ICD-10-CM

## 2023-01-05 DIAGNOSIS — R102 Pelvic and perineal pain: Secondary | ICD-10-CM

## 2023-01-05 DIAGNOSIS — R8290 Unspecified abnormal findings in urine: Secondary | ICD-10-CM | POA: Diagnosis not present

## 2023-01-05 DIAGNOSIS — N9089 Other specified noninflammatory disorders of vulva and perineum: Secondary | ICD-10-CM

## 2023-01-05 LAB — POCT URINALYSIS DIP (CLINITEK)
Bilirubin, UA: NEGATIVE
Blood, UA: NEGATIVE
Glucose, UA: NEGATIVE mg/dL
Ketones, POC UA: NEGATIVE mg/dL
Leukocytes, UA: NEGATIVE
Nitrite, UA: NEGATIVE
POC PROTEIN,UA: NEGATIVE
Spec Grav, UA: 1.015 (ref 1.010–1.025)
Urobilinogen, UA: 0.2 E.U./dL
pH, UA: 6 (ref 5.0–8.0)

## 2023-01-05 MED ORDER — PHENTERMINE HCL 37.5 MG PO TABS: 37.5000 mg | ORAL_TABLET | Freq: Every day | ORAL | 0 refills | Status: DC

## 2023-01-05 NOTE — Progress Notes (Unsigned)
   Acute Office Visit  Subjective:     Patient ID: Lauren Melton, female    DOB: July 14, 1986, 37 y.o.   MRN: [id number]  Chief Complaint  Patient presents with   Vaginal Itching    HPI Patient is in today for fibroid   Itchy discomfort no discharge watery no odor    Sure swab Normal urine [place]   ROS      Objective:    BP (!) 150/93   Pulse 70   Temp 98.7 F (37.1 C) (Oral)   Ht '5\' 7"'$  (1.702 m)   Wt (!) 321 lb (145.6 kg)   SpO2 100%   BMI 50.28 kg/m  {Vitals History (Optional):23777}  Physical Exam  Results for orders placed or performed in visit on 01/05/23  POCT URINALYSIS DIP (CLINITEK)  Result Value Ref Range   Color, UA yellow yellow   Clarity, UA clear clear   Glucose, UA negative negative mg/dL   Bilirubin, UA negative negative   Ketones, POC UA negative negative mg/dL   Spec Grav, UA 1.015 1.010 - 1.025   Blood, UA negative negative   pH, UA 6.0 5.0 - 8.0   POC PROTEIN,UA negative negative, trace   Urobilinogen, UA 0.2 0.2 or 1.0 E.U./dL   Nitrite, UA Negative Negative   Leukocytes, UA Negative Negative        Assessment & Plan:   Problem List Items Addressed This Visit   None Visit Diagnoses     Abnormal urine odor    -  Primary   Relevant Orders   POCT URINALYSIS DIP (CLINITEK) (Completed)   Urine Culture   SureSwab Advanced Vaginitis Plus,TMA   Vagina itching       Relevant Orders   POCT URINALYSIS DIP (CLINITEK) (Completed)   Urine Culture   SureSwab Advanced Vaginitis Plus,TMA       No orders of the defined types were placed in this encounter.   No follow-ups on file.  [name], PA-C

## 2023-01-06 ENCOUNTER — Encounter: Payer: Self-pay | Admitting: Physician Assistant

## 2023-01-06 DIAGNOSIS — N9089 Other specified noninflammatory disorders of vulva and perineum: Secondary | ICD-10-CM | POA: Insufficient documentation

## 2023-01-06 DIAGNOSIS — R102 Pelvic and perineal pain unspecified side: Secondary | ICD-10-CM | POA: Insufficient documentation

## 2023-01-06 LAB — SURESWAB® ADVANCED VAGINITIS PLUS,TMA
C. trachomatis RNA, TMA: NOT DETECTED
CANDIDA SPECIES: NOT DETECTED
Candida glabrata: NOT DETECTED
N. gonorrhoeae RNA, TMA: NOT DETECTED
SURESWAB(R) ADV BACTERIAL VAGINOSIS(BV),TMA: NEGATIVE
TRICHOMONAS VAGINALIS (TV),TMA: NOT DETECTED

## 2023-01-07 LAB — URINE CULTURE
MICRO NUMBER:: 14406188
Result:: NO GROWTH
SPECIMEN QUALITY:: ADEQUATE

## 2023-01-07 NOTE — Progress Notes (Signed)
No BV and no yeast.

## 2023-01-07 NOTE — Progress Notes (Signed)
No abnormal bacteria growth.

## 2023-01-19 NOTE — Progress Notes (Unsigned)
GYNECOLOGY OFFICE VISIT NOTE  History:   Lauren Melton is a 37 y.o. G1P1001 here today for suprapubic pain and fibroid. She had an [place] for the pain with some of her cycles which has progressively increased with time and a 8 mm fibroid was found.   I reviewed the images from her [place] myself and the fibroid is at most a class 3 fibroid but more likely class 4 with it being primarily intramural over submucosal.    I reviewed the records from her PCP office which showed UCX negative on 1/8 and vaginal swab was negative for BV and yeast as well as STIs. She also had a BV swab in October which was negative.   I read the note from 10/18 by Lauren Melton who reported the suprapubic pain being present for several months duration. I reviewed Lauren Melton's note from January 8th as well when she went for vaginal irritation.   Historically she has had recurrent BV. Recently when checked she is negative but it feels like BV when her symptoms flare. She tried Lotrisone but felt more irritated. With prior gyne it was more the opening but now it is the perineum, along her left labia majora and around the clitoral area, and at the vaginal opening at times. Sometimes it feels like a burning pain, but especially when she put the Lotrisone on it. She switched to a more mild soap - [employer] that is without scent at the recommendation of a friend who has struggled with BV. She tried boric acid but this burned more. She tested the pH of her vagina and it was 4.5 and would like to have it be lower knowing it should be more acidic.   Has not had odor. Denies discharge.    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   Health Maintenance:   Normal pap and negative HRHPV:   Diagnosis  Date Value Ref Range Status  09/23/2022   Final   - Negative for intraepithelial lesion or malignancy (NILM)   Review of Systems:  Pertinent items noted in HPI and remainder of comprehensive ROS otherwise negative.  Physical Exam:  BP (!) 150/82   Pulse 74   Ht '5\' 7"'$  (1.702 m)   Wt (!) 311 lb (141.1 kg)   LMP 01/17/2023   BMI 48.71 kg/m  CONSTITUTIONAL: Well-developed, well-nourished female in no acute distress.  HEENT:  Normocephalic, atraumatic. External right and left ear normal. No scleral icterus.  NECK: Normal range of motion, supple, no masses noted on observation SKIN: No rash noted. Not diaphoretic. No erythema. No pallor. MUSCULOSKELETAL: Normal range of motion. No edema noted. NEUROLOGIC: Alert and oriented to person, place, and time. Normal muscle tone coordination. No cranial nerve deficit noted. PSYCHIATRIC: Normal mood and affect. Normal behavior. Normal judgment and thought content.  CARDIOVASCULAR: Normal heart rate noted RESPIRATORY: Effort and breath sounds normal, no problems with respiration noted ABDOMEN: No masses noted. No other  overt distention noted.    PELVIC: Normal appearing external genitalia but has some thickening of the skin along the left labia majora and the perineum. There are breaks in the skin along the left labia majora and the clitoral area. Normal urethral meatus; normal appearing vaginal mucosa.  No abnormal discharge noted. Performed in the presence of a chaperone  Labs and Imaging No results found for this or any previous visit (from the past 168 hour(s)). No results found.  Assessment and Plan:  Lauren Melton was seen today for new  gyn.  Diagnoses and all orders for this visit:  Fibroid Would not recommend any surgical intervention for fibroid of this size or location. I do not think it is the source of her pain/bleeding. It can be followed intermittently with [place] either based on symptoms (bleeding, pain) changing or could recheck in 2 years to ensure to rapid change in growth since she is 37yo. However, we discussed she could try TXA for her heavy/painful periods since reduction in bleeding will often times help with cramps/pain. We discussed other options would be hormonal options but would hold on these while we try to correct the vaginal irritation and sometimes birth control can cause yeast infections etc.  -     tranexamic acid (LYSTEDA) 650 MG TABS tablet; Take 2 tablets (1,300 mg total) by mouth 3 (three) times daily. 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 Rechecked for BV today as a precaution but suspicion is low. Discussed first we should treat the external irritation with steroid ointment (reviewed problems with cream) and then do taper over the next several weeks. Reviewed switching from soap to cleanser I.e. cetaphil 5 but initially I would only use warm wet wash cloth for bathing. After 2 weeks of steroid, may introduce one change at a time I.e. cleanser or something to help with vaginal pH if she wishes. For vaginal pH she may try ACV baths/washing or Replens. My first choice would be Replens since gentler.  -     Cervicovaginal ancillary only( [place]) -     triamcinolone ointment (KENALOG) 0.1 %; Apply 1 Application topically 2 (two) times daily. Apply to affected area two times daily for 2 weeks, then nightly for 2 weeks, then 2-3 times a week for 4 weeks.  Routine preventative health maintenance measures emphasized. Please refer to After Visit Summary for other counseling recommendations.   No follow-ups on  file.  Lauren Gunning, MD, [place] for [facility], [place]

## 2023-01-22 ENCOUNTER — Ambulatory Visit: Payer: Managed Care, Other (non HMO) | Admitting: Obstetrics and Gynecology

## 2023-01-22 ENCOUNTER — Encounter: Payer: Self-pay | Admitting: Obstetrics and Gynecology

## 2023-01-22 ENCOUNTER — Other Ambulatory Visit (HOSPITAL_COMMUNITY)
Admission: RE | Admit: 2023-01-22 | Discharge: 2023-01-22 | Disposition: A | Discharge status: Home / Self Care | Payer: Managed Care, Other (non HMO) | Source: Ambulatory Visit | Attending: Obstetrics and Gynecology | Admitting: Obstetrics and Gynecology

## 2023-01-22 VITALS — BP 150/82 | HR 74 | Ht 67.0 in | Wt 311.0 lb

## 2023-01-22 DIAGNOSIS — R102 Pelvic and perineal pain: Secondary | ICD-10-CM

## 2023-01-22 DIAGNOSIS — N898 Other specified noninflammatory disorders of vagina: Secondary | ICD-10-CM | POA: Diagnosis not present

## 2023-01-22 DIAGNOSIS — D219 Benign neoplasm of connective and other soft tissue, unspecified: Secondary | ICD-10-CM

## 2023-01-22 MED ORDER — TRANEXAMIC ACID 650 MG PO TABS: 1300.0000 mg | ORAL_TABLET | Freq: Three times a day (TID) | ORAL | 3 refills | Status: DC

## 2023-01-22 MED ORDER — TRIAMCINOLONE ACETONIDE 0.1 % EX OINT: 1.0000 | TOPICAL_OINTMENT | Freq: Two times a day (BID) | CUTANEOUS | 1 refills | Status: DC

## 2023-01-22 NOTE — Progress Notes (Signed)
Pt c/o vaginal irritation. Pt states every time she gets tested for BV and yeast it is negative.

## 2023-01-23 LAB — CERVICOVAGINAL ANCILLARY ONLY
Bacterial Vaginitis (gardnerella): NEGATIVE
Candida Glabrata: NEGATIVE
Candida Vaginitis: NEGATIVE
Comment: NEGATIVE
Comment: NEGATIVE
Comment: NEGATIVE

## 2023-03-02 ENCOUNTER — Ambulatory Visit: Payer: Managed Care, Other (non HMO)

## 2023-03-02 ENCOUNTER — Ambulatory Visit: Payer: Managed Care, Other (non HMO) | Admitting: Obstetrics and Gynecology

## 2023-03-02 ENCOUNTER — Other Ambulatory Visit: Payer: Self-pay | Admitting: Physician Assistant

## 2023-03-02 ENCOUNTER — Ambulatory Visit: Payer: Managed Care, Other (non HMO) | Admitting: Physician Assistant

## 2023-03-02 ENCOUNTER — Encounter: Payer: Self-pay | Admitting: Physician Assistant

## 2023-03-02 VITALS — BP 118/72 | HR 82 | Ht 67.0 in | Wt 304.0 lb

## 2023-03-02 DIAGNOSIS — M25551 Pain in right hip: Secondary | ICD-10-CM

## 2023-03-02 DIAGNOSIS — G8929 Other chronic pain: Secondary | ICD-10-CM

## 2023-03-02 DIAGNOSIS — E559 Vitamin D deficiency, unspecified: Secondary | ICD-10-CM

## 2023-03-02 DIAGNOSIS — R239 Unspecified skin changes: Secondary | ICD-10-CM | POA: Diagnosis not present

## 2023-03-02 DIAGNOSIS — M25552 Pain in left hip: Secondary | ICD-10-CM

## 2023-03-02 DIAGNOSIS — Z6841 Body Mass Index (BMI) 40.0 and over, adult: Secondary | ICD-10-CM

## 2023-03-02 DIAGNOSIS — Z113 Encounter for screening for infections with a predominantly sexual mode of transmission: Secondary | ICD-10-CM

## 2023-03-02 DIAGNOSIS — E66813 Obesity, class 3: Secondary | ICD-10-CM

## 2023-03-03 ENCOUNTER — Encounter: Payer: Self-pay | Admitting: Physician Assistant

## 2023-03-03 DIAGNOSIS — M160 Bilateral primary osteoarthritis of hip: Secondary | ICD-10-CM | POA: Insufficient documentation

## 2023-03-03 LAB — COMPLETE METABOLIC PANEL WITH GFR
AG Ratio: 1.4 (calc) (ref 1.0–2.5)
ALT: 16 U/L (ref 6–29)
AST: 12 U/L (ref 10–30)
Albumin: 4.5 g/dL (ref 3.6–5.1)
Alkaline phosphatase (APISO): 76 U/L (ref 31–125)
BUN: 9 mg/dL (ref 7–25)
CO2: 24 mmol/L (ref 20–32)
Calcium: 9.7 mg/dL (ref 8.6–10.2)
Chloride: 101 mmol/L (ref 98–110)
Creat: 0.74 mg/dL (ref 0.50–0.97)
Globulin: 3.2 g/dL (calc) (ref 1.9–3.7)
Glucose, Bld: 89 mg/dL (ref 65–99)
Potassium: 4.5 mmol/L (ref 3.5–5.3)
Sodium: 137 mmol/L (ref 135–146)
Total Bilirubin: 0.6 mg/dL (ref 0.2–1.2)
Total Protein: 7.7 g/dL (ref 6.1–8.1)
eGFR: 106 mL/min/{1.73_m2} (ref >=60)

## 2023-03-03 LAB — CBC WITH DIFFERENTIAL/PLATELET
Absolute Monocytes: 353 cells/uL (ref 200–950)
Basophils Absolute: 68 cells/uL (ref 0–200)
Basophils Relative: 1.2 %
Eosinophils Absolute: 131 cells/uL (ref 15–500)
Eosinophils Relative: 2.3 %
HCT: 37 % (ref 35.0–45.0)
Hemoglobin: 12.4 g/dL (ref 11.7–15.5)
Lymphs Abs: 2656 cells/uL (ref 850–3900)
MCH: 27.8 pg (ref 27.0–33.0)
MCHC: 33.5 g/dL (ref 32.0–36.0)
MCV: 83 fL (ref 80.0–100.0)
MPV: 9.3 fL (ref 7.5–12.5)
Monocytes Relative: 6.2 %
Neutro Abs: 2491 cells/uL (ref 1500–7800)
Neutrophils Relative %: 43.7 %
Platelets: 425 10*3/uL — ABNORMAL HIGH (ref 140–400)
RBC: 4.46 10*6/uL (ref 3.80–5.10)
RDW: 14.4 % (ref 11.0–15.0)
Total Lymphocyte: 46.6 %
WBC: 5.7 10*3/uL (ref 3.8–10.8)

## 2023-03-03 LAB — VITAMIN D 25 HYDROXY (VIT D DEFICIENCY, FRACTURES): Vit D, 25-Hydroxy: 38 ng/mL (ref 30–100)

## 2023-03-03 LAB — HEPATITIS PANEL, ACUTE
Hep A IgM: NONREACTIVE
Hep B C IgM: NONREACTIVE
Hepatitis B Surface Ag: NONREACTIVE
Hepatitis C Ab: NONREACTIVE

## 2023-03-03 LAB — SEDIMENTATION RATE: Sed Rate: 9 mm/h (ref 0–20)

## 2023-03-03 LAB — RPR: RPR Ser Ql: NONREACTIVE

## 2023-03-03 LAB — HIV ANTIBODY (ROUTINE TESTING W REFLEX): HIV 1&2 Ab, 4th Generation: NONREACTIVE

## 2023-03-03 LAB — C-REACTIVE PROTEIN: CRP: 7.8 mg/L (ref <8.0)

## 2023-03-03 LAB — TSH: TSH: 1.73 mIU/L

## 2023-03-03 NOTE — Progress Notes (Signed)
Bilateral hip arthritis that is more prominent in the right hip. Injections in the hip could help, weight loss, and NSAID use all can help manage the pain. Would you like to discuss with sports medicine provider?

## 2023-03-04 NOTE — Progress Notes (Signed)
Lauren Melton,   RPR/HIV-negative CRP/ESR(inflammation) low Hepatitis A/B/C negative Thyroid normal Kidney, liver, glucose looks good.  Vitamin D is normal Platelets a little high Hemoglobin looks good

## 2023-03-10 ENCOUNTER — Encounter: Payer: Self-pay | Admitting: Physician Assistant

## 2023-03-10 NOTE — Progress Notes (Signed)
Established Patient Office Visit  Subjective   Patient ID: Lauren Melton, female    DOB: [birth year]  Age: 37 y.o. MRN: [id number]  No chief complaint on file.   HPI Pt is a 37 yo obese female who presents to the clinic with some health concerns.   She is having red bloches on her bilateral hands and left hip pain. She has had hip pain off and on for some time but seems to be getting worse. She is trying to focus on weight loss. She is walking more and on phentermine. She wants STD testing.    .. 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   Class 3 severe obesity due to excess calories without serious comorbidity with body mass index (BMI) of 45.0 to 49.9 in adult ([place]) 08/27/2016   Vitamin D insufficiency 08/29/2016   Slow transit constipation 05/26/2018   Acute stress reaction 09/09/2018   Chronic tension-type headache, not intractable 08/02/2019   Snoring 08/02/2019   Non-restorative sleep 08/02/2019   Grief reaction 04/17/2020   Acute bilateral low back pain without sciatica 04/17/2020   Migraine without aura and without status migrainosus, not intractable 03/19/2021   Depressed mood 03/19/2021   Acute pain of left knee 09/23/2021   Left-sided chest wall pain 02/24/2022   Palpitations 02/24/2022   Neck pain 02/24/2022   Frequent headaches 02/24/2022   Mid back pain 02/24/2022   Breast tenderness 09/30/2022   Dysmenorrhea 09/30/2022   Vagina itching 09/30/2022   Suprapubic pain 10/27/2022   Clitoral irritation 01/06/2023   Pelvic pressure in female 01/06/2023   Left hip pain 03/02/2023   Recent skin changes 03/02/2023   Bilateral primary osteoarthritis of hip 03/03/2023   Resolved Ambulatory Problems    Diagnosis Date Noted   Cesarean delivery delivered 01/14/2016   No energy 08/27/2016   New daily persistent headache 07/27/2019   Sore  throat [id number]   Complicated grief 0000000   Cloudy urine 09/30/2022   Dysuria 10/27/2022   Past Medical History:  Diagnosis Date   HSV-2 seropositive    Hypertension     ROS See HPI.    Objective:     BP 118/72   Pulse 82   Ht '5\' 7"'$  (1.702 m)   Wt (!) 304 lb (137.9 kg)   SpO2 98%   BMI 47.61 kg/m  BP Readings from Last 3 Encounters:  03/02/23 118/72  01/22/23 (!) 150/82  01/05/23 138/80   Wt Readings from Last 3 Encounters:  03/02/23 (!) 304 lb (137.9 kg)  01/22/23 (!) 311 lb (141.1 kg)  01/05/23 (!) 321 lb (145.6 kg)      Physical Exam Constitutional:      Appearance: Normal appearance. She is obese.  HENT:     Head: Normocephalic.  Cardiovascular:     Rate and Rhythm: Normal rate and regular rhythm.  Pulmonary:     Effort: Pulmonary effort is normal.     Breath sounds: Normal breath sounds.  Musculoskeletal:        General: Normal range of motion.     Comments: No tenderness to palpation over right greater trochanter.  NROM of right leg.  Strength 5/5 right leg.   Skin:    Comments: Red bloches on bilateral palms, clammy hands.   Neurological:     General: No focal deficit present.     Mental Status: She is alert  and oriented to person, place, and time.  Psychiatric:        Mood and Affect: Mood normal.      Results for orders placed or performed in visit on 03/02/23  VITAMIN D 25 Hydroxy (Vit-D Deficiency, Fractures)  Result Value Ref Range   Vit D, 25-Hydroxy 38 30 - 100 ng/mL  COMPLETE METABOLIC PANEL WITH GFR  Result Value Ref Range   Glucose, Bld 89 65 - 99 mg/dL   BUN 9 7 - 25 mg/dL   Creat 0.74 0.50 - 0.97 mg/dL   eGFR 106 > OR = 60 mL/min/1.21m   BUN/Creatinine Ratio SEE NOTE: 6 - 22 (calc)   Sodium 137 135 - 146 mmol/L   Potassium 4.5 3.5 - 5.3 mmol/L   Chloride 101 98 - 110 mmol/L   CO2 24 20 - 32 mmol/L   Calcium 9.7 8.6 - 10.2 mg/dL   Total Protein 7.7 6.1 - 8.1 g/dL   Albumin 4.5 3.6 - 5.1 g/dL   Globulin 3.2 1.9 -  3.7 g/dL (calc)   AG Ratio 1.4 1.0 - 2.5 (calc)   Total Bilirubin 0.6 0.2 - 1.2 mg/dL   Alkaline phosphatase (APISO) 76 31 - 125 U/L   AST 12 10 - 30 U/L   ALT 16 6 - 29 U/L  TSH  Result Value Ref Range   TSH 1.73 mIU/L  CBC w/Diff/Platelet  Result Value Ref Range   WBC 5.7 3.8 - 10.8 Thousand/uL   RBC 4.46 3.80 - 5.10 Million/uL   Hemoglobin 12.4 11.7 - 15.5 g/dL   HCT 37.0 35.0 - 45.0 %   MCV 83.0 80.0 - 100.0 fL   MCH 27.8 27.0 - 33.0 pg   MCHC 33.5 32.0 - 36.0 g/dL   RDW 14.4 11.0 - 15.0 %   Platelets 425 (H) 140 - 400 Thousand/uL   MPV 9.3 7.5 - 12.5 fL   Neutro Abs 2,491 1,500 - 7,800 cells/uL   Lymphs Abs 2,656 850 - 3,900 cells/uL   Absolute Monocytes 353 200 - 950 cells/uL   Eosinophils Absolute 131 15 - 500 cells/uL   Basophils Absolute 68 0 - 200 cells/uL   Neutrophils Relative % 43.7 %   Total Lymphocyte 46.6 %   Monocytes Relative 6.2 %   Eosinophils Relative 2.3 %   Basophils Relative 1.2 %  Hepatitis panel, acute  Result Value Ref Range   Hep A IgM NON-REACTIVE NON-REACTIVE   Hepatitis B Surface Ag NON-REACTIVE NON-REACTIVE   Hep B C IgM NON-REACTIVE NON-REACTIVE   Hepatitis C Ab NON-REACTIVE NON-REACTIVE  Sed Rate (ESR)  Result Value Ref Range   Sed Rate 9 0 - 20 mm/h  C-reactive protein  Result Value Ref Range   CRP 7.8 <8.0 mg/L  HIV Antibody (routine testing w rflx)  Result Value Ref Range   HIV 1&2 Ab, 4th Generation NON-REACTIVE NON-REACTIVE  RPR  Result Value Ref Range   RPR Ser Ql NON-REACTIVE NON-REACTIVE        Assessment & Plan:  .[name] KitchenMarland Kitcheniagnoses and all orders for this visit:  Class 3 severe obesity due to excess calories without serious comorbidity with body mass index (BMI) of 45.0 to 49.9 in adult ([facility]  Recent skin changes -     VITAMIN D 25 Hydroxy (Vit-D Deficiency, Fractures) -     COMPLETE METABOLIC PANEL WITH GFR -     TSH -     CBC w/Diff/Platelet -     Hepatitis panel, acute -  Sed Rate (ESR) -     C-reactive  protein -     Cancel: DG Hip Unilat W OR W/O Pelvis 2-3 Views Left; Future  Left hip pain -     Sed Rate (ESR) -     C-reactive protein -     Cancel: DG Hip Unilat W OR W/O Pelvis 2-3 Views Left; Future  Vitamin D deficiency -     VITAMIN D 25 Hydroxy (Vit-D Deficiency, Fractures)  Screen for STD (sexually transmitted disease) -     HIV antibody (with reflex) -     RPR  Other orders -     HIV Antibody (routine testing w rflx) -     RPR    Testing ordered as patient wishes Will get xray of right hip to look for OA Continue to work on weight loss Clamy blochy hands could be excessive sweating as side effect of phentermine.    [name], PA-C

## 2023-03-19 ENCOUNTER — Other Ambulatory Visit: Payer: Self-pay | Admitting: Pharmacist

## 2023-03-19 NOTE — Progress Notes (Signed)
Patient appearing on report for [employer] - Hypertension Control report due to last documented ambulatory blood pressure of 150/82 on 01/22/23. Next appointment with PCP is not scheduled.   Outreached patient to discuss hypertension control and medication management. Patient answered but declined conversation, busy during work hours. Of note, pressures after 01/22/23 have lowered to goal range at subsequent office visits.  Larinda Buttery, PharmD Clinical Pharmacist [facility] [phone]

## 2023-03-31 ENCOUNTER — Ambulatory Visit: Payer: Managed Care, Other (non HMO)

## 2023-03-31 ENCOUNTER — Other Ambulatory Visit (HOSPITAL_COMMUNITY)
Admission: RE | Admit: 2023-03-31 | Discharge: 2023-03-31 | Disposition: A | Discharge status: Home / Self Care | Payer: Managed Care, Other (non HMO) | Source: Ambulatory Visit

## 2023-03-31 VITALS — BP 146/94 | HR 82 | Ht 67.0 in | Wt 308.0 lb

## 2023-03-31 DIAGNOSIS — R399 Unspecified symptoms and signs involving the genitourinary system: Secondary | ICD-10-CM | POA: Diagnosis not present

## 2023-03-31 DIAGNOSIS — N898 Other specified noninflammatory disorders of vagina: Secondary | ICD-10-CM

## 2023-03-31 NOTE — Progress Notes (Signed)
   GYNECOLOGY PROGRESS NOTE  History:  37 y.o. G1P1001 presents to [facility] office today for problem gyn visit. She reports ongoing vaginal irritation and swelling/excoriation on perineum. She saw Dr. Damita Dunnings on 1/25 for the same problem. Symptoms are strictly external. She reports she used the steroid cream which helped and now only occurs "when she has a flare up". She reports it feels as if her vaginal opening is irritated and red. She used terazol cream x6 days which did relieve symptoms initially however symptoms have since returned and it feels as if her perineum is swollen and there is an "achy, squeezing pain". She reports she checks her vaginal pH which ranges between 4-4.5. The only time her symptoms seem to be relieved are when she is on her period. She reports there is a lot of burning with urination but thinks this is related to urine hitting her skin. She only uses warm water to cleanse herself, she wears cotton underwear throughout the day and none at bedtime. She expresses a long history of BV. She is not sexually active.    The following portions of the patient's history were reviewed and updated as appropriate: allergies, current medications, past family history, past medical history, past social history, past surgical history and problem list. Last pap smear on 09/23/2022 was normal, NILM, negative HRHPV.  Health Maintenance Due  Topic Date Due   DTaP/Tdap/Td (3 - Tdap) 08/02/2019     Review of Systems:  Pertinent items are noted in HPI.   Objective:  Physical Exam Blood pressure (!) 146/94, pulse 82, height 5\' 7"  (1.702 m), weight (!) 308 lb (139.7 kg), last menstrual period 03/10/2023, unknown if currently breastfeeding. VS reviewed, nursing note reviewed,  Constitutional: well developed, well nourished, no distress HEENT: normocephalic CV: normal rate Pulm/chest wall: normal effort Breast Exam: deferred Abdomen: soft Neuro: alert and oriented x 3 Skin: warm,  dry Psych: affect normal Pelvic exam: Normal external female genitalia, vaginal introitus slightly erythematous, some minor excoriations at the perineum, normal urethral meatus, moderate amount of thin white discharge, vaginal mucosa pink   Assessment & Plan:  1. UTI symptoms  - Urine Culture  2. Vaginal irritation - Patient instructed to use steroid cream as previously prescribed as this seemed to help her symptoms  - Continue to avoid soaps/cleansers, wear cotton underwear - Patient requesting terazol but will hold off until culture results - Will bring patient back to meet with MD for further evaluation  - Can consider dermatology referral   - Cervicovaginal ancillary only( [place])   Return in about 3 weeks (around 04/21/2023) for f/u with MD.   Renee Harder, CNM 4:57 PM

## 2023-04-01 LAB — CERVICOVAGINAL ANCILLARY ONLY
Bacterial Vaginitis (gardnerella): NEGATIVE
Candida Glabrata: NEGATIVE
Candida Vaginitis: NEGATIVE
Comment: NEGATIVE
Comment: NEGATIVE
Comment: NEGATIVE

## 2023-04-03 LAB — URINE CULTURE

## 2023-04-08 ENCOUNTER — Telehealth: Payer: Self-pay | Admitting: *Deleted

## 2023-04-08 NOTE — Telephone Encounter (Signed)
Pt stopped into office because she was concerned that she had not be notified of her abnormal urine culture.  The culture showed 10-25K colonies of BStrep.  I explained that this does not need antibiotics.If she had been symptomatic and it had grown out over 100K colonies then she would have been given medication.  She said well I am still a little itching "down there".  Her BV and Yeast were negative.  I told her it was ok to try a little monistat on that area.  Pt is satisfied with that recommendation.

## 2023-04-09 ENCOUNTER — Ambulatory Visit: Payer: Managed Care, Other (non HMO) | Admitting: Family Medicine

## 2023-04-09 NOTE — Progress Notes (Deleted)
   Acute Office Visit  Subjective:     Patient ID: Lauren Melton, female    DOB: 1986/07/15, 37 y.o.   MRN: [id number]  No chief complaint on file.   HPI Patient is in today for urinary symptoms.   She was seen a week ago by OB/GYN for external vaginal and perineal irritation.  He was negative for yeast or BV and urine culture was also negative.  ROS      Objective:    LMP 03/10/2023  {Vitals History (Optional):23777}  Physical Exam  No results found for any visits on 04/09/23.      Assessment & Plan:   Problem List Items Addressed This Visit   None   No orders of the defined types were placed in this encounter.   No follow-ups on file.  Nani Gasser, MD

## 2023-04-10 ENCOUNTER — Ambulatory Visit: Payer: Managed Care, Other (non HMO) | Admitting: Physician Assistant

## 2023-04-10 ENCOUNTER — Encounter: Payer: Self-pay | Admitting: Physician Assistant

## 2023-04-10 VITALS — BP 155/96 | HR 72 | Ht 67.0 in | Wt 310.2 lb

## 2023-04-10 DIAGNOSIS — I10 Essential (primary) hypertension: Secondary | ICD-10-CM

## 2023-04-10 DIAGNOSIS — R8271 Bacteriuria: Secondary | ICD-10-CM | POA: Diagnosis not present

## 2023-04-10 DIAGNOSIS — N898 Other specified noninflammatory disorders of vagina: Secondary | ICD-10-CM | POA: Diagnosis not present

## 2023-04-10 MED ORDER — BISOPROLOL-HYDROCHLOROTHIAZIDE 10-6.25 MG PO TABS: 1.0000 | ORAL_TABLET | Freq: Every day | ORAL | 0 refills | Status: DC

## 2023-04-10 MED ORDER — AMOXICILLIN 875 MG PO TABS: 875.0000 mg | ORAL_TABLET | Freq: Two times a day (BID) | ORAL | 0 refills | Status: DC

## 2023-04-10 MED ORDER — FLUCONAZOLE 150 MG PO TABS: 150.0000 mg | ORAL_TABLET | Freq: Once | ORAL | 0 refills | Status: AC

## 2023-04-10 NOTE — Patient Instructions (Signed)
Return urine after finish amoxil.

## 2023-04-10 NOTE — Progress Notes (Unsigned)
Acute Office Visit  Subjective:     Patient ID: Lauren Melton, female    DOB: 1986/02/18, 37 y.o.   MRN: [id number]  Chief Complaint  Patient presents with   Urinary Tract Infection    HPI Patient is in today to discuss group B strep in urine from GYN. They will not treat her but she reports to be symptomatic with vaginal irrigation and dysuria.   Denies any CP, palpitations, headaches, or vision changes. Taking ziac daily.   .. 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   Class 3 severe obesity due to excess calories without serious comorbidity with body mass index (BMI) of 45.0 to 49.9 in adult 08/27/2016   Vitamin D insufficiency 08/29/2016   Slow transit constipation 05/26/2018   Acute stress reaction 09/09/2018   Chronic tension-type headache, not intractable 08/02/2019   Snoring 08/02/2019   Non-restorative sleep 08/02/2019   Grief reaction 04/17/2020   Acute bilateral low back pain without sciatica 04/17/2020   Migraine without aura and without status migrainosus, not intractable 03/19/2021   Depressed mood 03/19/2021   Acute pain of left knee 09/23/2021   Left-sided chest wall pain 02/24/2022   Palpitations 02/24/2022   Neck pain 02/24/2022   Frequent headaches 02/24/2022   Mid back pain 02/24/2022   Breast tenderness 09/30/2022   Dysmenorrhea 09/30/2022   Vagina itching 09/30/2022   Suprapubic pain 10/27/2022   Clitoral irritation 01/06/2023   Pelvic pressure in female 01/06/2023   Left hip pain 03/02/2023   Recent skin changes 03/02/2023   Bilateral primary osteoarthritis of hip 03/03/2023   Group B streptococcal bacteriuria 04/10/2023   Vaginal irritation 04/11/2023   Resolved Ambulatory Problems    Diagnosis Date Noted   Cesarean delivery delivered 01/14/2016   No energy 08/27/2016   New daily persistent headache 07/27/2019    Sore throat 07/29/2020   Complicated grief 09/11/2020   Cloudy urine 09/30/2022   Dysuria 10/27/2022   Past Medical History:  Diagnosis Date   HSV-2 seropositive    Hypertension      ROS See HPI.      Objective:    BP (!) 155/96 (BP Location: Right Arm, Patient Position: Sitting, Cuff Size: Large)   Pulse 72   Ht 5\' 7"  (1.702 m)   Wt (!) 310 lb 3.2 oz (140.7 kg)   LMP 03/10/2023   SpO2 98%   BMI 48.58 kg/m  BP Readings from Last 3 Encounters:  04/10/23 (!) 155/96  03/31/23 (!) 146/94  03/02/23 118/72   Wt Readings from Last 3 Encounters:  04/10/23 (!) 310 lb 3.2 oz (140.7 kg)  03/31/23 (!) 308 lb (139.7 kg)  03/02/23 (!) 304 lb (137.9 kg)      Physical Exam Constitutional:      Appearance: Normal appearance. She is obese.  Cardiovascular:     Rate and Rhythm: Normal rate.     Pulses: Normal pulses.  Neurological:     General: No focal deficit present.     Mental Status: She is alert and oriented to person, place, and time.  Psychiatric:        Mood and Affect: Mood normal.          Assessment & Plan:  [name] KitchenMarland KitchenSamathia was seen today for urinary tract infection.  Diagnoses and all orders for this visit:  Group B streptococcal bacteriuria -     amoxicillin (  AMOXIL) 875 MG tablet; Take 1 tablet (875 mg total) by mouth 2 (two) times daily. For 7 days. -     Urine Culture  Vaginal irritation  Essential hypertension -     bisoprolol-hydrochlorothiazide (ZIAC) 10-6.25 MG tablet; Take 1 tablet by mouth daily.  Other orders -     fluconazole (DIFLUCAN) 150 MG tablet; Take 1 tablet (150 mg total) by mouth once for 1 dose. Repeat in 48-72 hours  Pt is symptomatic with group B strep in urine despite lower colony count Will treat with amoxicillin for 7 days Diflucan due to yeast after abx Urine culture order for after abx completion Ok to use as needed topical steroid for vaginal irritation BP not to goal, increased ziac to 10/6.25 Nurse visit BP recheck in  2 weeks     Tandy Gaw, PA-C

## 2023-04-11 DIAGNOSIS — N898 Other specified noninflammatory disorders of vagina: Secondary | ICD-10-CM | POA: Insufficient documentation

## 2023-04-18 LAB — URINE CULTURE
MICRO NUMBER:: 14849900
SPECIMEN QUALITY:: ADEQUATE

## 2023-04-20 ENCOUNTER — Encounter: Payer: Self-pay | Admitting: Obstetrics & Gynecology

## 2023-04-20 ENCOUNTER — Ambulatory Visit: Payer: Managed Care, Other (non HMO) | Admitting: Obstetrics & Gynecology

## 2023-04-20 ENCOUNTER — Encounter: Payer: Self-pay | Admitting: Physician Assistant

## 2023-04-20 ENCOUNTER — Other Ambulatory Visit (HOSPITAL_COMMUNITY)
Admission: RE | Admit: 2023-04-20 | Discharge: 2023-04-20 | Disposition: A | Discharge status: Home / Self Care | Payer: Managed Care, Other (non HMO) | Source: Ambulatory Visit | Attending: Obstetrics & Gynecology | Admitting: Obstetrics & Gynecology

## 2023-04-20 VITALS — BP 132/84 | HR 74 | Resp 18 | Ht 67.0 in | Wt 309.0 lb

## 2023-04-20 DIAGNOSIS — N94819 Vulvodynia, unspecified: Secondary | ICD-10-CM | POA: Diagnosis not present

## 2023-04-20 DIAGNOSIS — N898 Other specified noninflammatory disorders of vagina: Secondary | ICD-10-CM | POA: Insufficient documentation

## 2023-04-20 MED ORDER — DESIPRAMINE HCL 10 MG PO TABS: 10.0000 mg | ORAL_TABLET | Freq: Every day | ORAL | 2 refills | Status: DC

## 2023-04-20 NOTE — Progress Notes (Signed)
   Subjective:    Patient ID: Lauren Melton, female    DOB: 1986/01/11, 37 y.o.   MRN: [id number]  HPI  37 yo female presents for vulvar complaints.  She has seen multiple providers both within [facility] and outside of [facility].  She never had a biopsy.  She has been given advice on creams and vulvar hygiene.  Some are listed below Started last year--has tried steroids and comes back when stopped.  Has had recurrent BV in the past Has had UTIs in the past (neg culture with [name] Swelling in perineum--uses A 7 D ointment Soap--water on vulva (coast on body in shower)  The patient overall feels that everything burns especially her perineum.  It can be provoked and unprovoked.  Patient is not sexually active and has not been for over a year.   Review of Systems  Constitutional: Negative.   Respiratory: Negative.    Cardiovascular: Negative.   Gastrointestinal: Negative.   Genitourinary:  Positive for vaginal discharge and vaginal pain. Negative for dysuria.  Psychiatric/Behavioral: Negative.         Objective:   Physical Exam Vitals reviewed.  Constitutional:      General: She is not in acute distress.    Appearance: She is well-developed.  HENT:     Head: Normocephalic and atraumatic.  Eyes:     Conjunctiva/sclera: Conjunctivae normal.  Cardiovascular:     Rate and Rhythm: Normal rate.  Pulmonary:     Effort: Pulmonary effort is normal.  Genitourinary:      Comments: Tanner V Mild erythema at fourchette Vagina well estrogenized Cervix--no lesion Pain introitus and vestibule--skene's and vestibular glands Mild pain over pelvic floor muscles.  Skin:    General: Skin is warm and dry.  Neurological:     Mental Status: She is alert and oriented to person, place, and time.  Psychiatric:        Mood and Affect: Mood normal.    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 Patient interested in medications.  We will try low-dose tricyclic antidepressant.  Will start with 10 mg of desipramine at night. Will rule out yeast infection as vaginitis is possible on top of vulvodynia. Patient advised to stop use of tea tree oil and A&E ointment. Vulvar hygiene reviewed again. Return to clinic in 6 weeks. 45 minutes spent in patient encounter.  This included review of records, history and physical, counseling, coordination of care, review diagrams of vulva and pudendal nerve, and documentation.

## 2023-04-20 NOTE — Progress Notes (Signed)
Gram positive bacteria cleared which is the strep that was seen. You have a very very small colony count of gram negative now detected that suspected is contamination. Are you having any symptoms?

## 2023-04-21 LAB — CERVICOVAGINAL ANCILLARY ONLY
Bacterial Vaginitis (gardnerella): NEGATIVE
Candida Glabrata: NEGATIVE
Candida Vaginitis: NEGATIVE
Comment: NEGATIVE
Comment: NEGATIVE
Comment: NEGATIVE

## 2023-04-21 MED ORDER — FLUCONAZOLE 150 MG PO TABS: 150.0000 mg | ORAL_TABLET | Freq: Once | ORAL | 0 refills | Status: AC

## 2023-06-08 ENCOUNTER — Ambulatory Visit: Payer: Managed Care, Other (non HMO) | Admitting: Obstetrics & Gynecology

## 2023-06-18 ENCOUNTER — Ambulatory Visit (INDEPENDENT_AMBULATORY_CARE_PROVIDER_SITE_OTHER): Payer: Managed Care, Other (non HMO)

## 2023-06-18 ENCOUNTER — Ambulatory Visit: Payer: Managed Care, Other (non HMO) | Admitting: Family Medicine

## 2023-06-18 ENCOUNTER — Encounter: Payer: Self-pay | Admitting: Family Medicine

## 2023-06-18 VITALS — BP 132/87 | HR 74 | Resp 16 | Ht 67.0 in | Wt 310.2 lb

## 2023-06-18 DIAGNOSIS — N898 Other specified noninflammatory disorders of vagina: Secondary | ICD-10-CM | POA: Diagnosis not present

## 2023-06-18 DIAGNOSIS — R1032 Left lower quadrant pain: Secondary | ICD-10-CM | POA: Diagnosis not present

## 2023-06-18 DIAGNOSIS — R10A0 Flank pain, unspecified side: Secondary | ICD-10-CM

## 2023-06-18 DIAGNOSIS — R109 Unspecified abdominal pain: Secondary | ICD-10-CM | POA: Diagnosis not present

## 2023-06-18 DIAGNOSIS — R350 Frequency of micturition: Secondary | ICD-10-CM

## 2023-06-18 DIAGNOSIS — R300 Dysuria: Secondary | ICD-10-CM

## 2023-06-18 DIAGNOSIS — R1031 Right lower quadrant pain: Secondary | ICD-10-CM

## 2023-06-18 LAB — WET PREP FOR TRICH, YEAST, CLUE
MICRO NUMBER:: 15107069
Specimen Quality: ADEQUATE

## 2023-06-18 LAB — POCT URINALYSIS DIP (CLINITEK)
Bilirubin, UA: NEGATIVE
Blood, UA: NEGATIVE
Glucose, UA: NEGATIVE mg/dL
Ketones, POC UA: NEGATIVE mg/dL
Leukocytes, UA: NEGATIVE
Nitrite, UA: NEGATIVE
POC PROTEIN,UA: NEGATIVE
Spec Grav, UA: 1.025 (ref 1.010–1.025)
Urobilinogen, UA: 0.2 E.U./dL
pH, UA: 6 (ref 5.0–8.0)

## 2023-06-18 MED ORDER — METRONIDAZOLE 0.75 % EX GEL: 1.0000 | Freq: Two times a day (BID) | CUTANEOUS | 3 refills | Status: DC

## 2023-06-18 NOTE — Assessment & Plan Note (Signed)
Patient has positive CVA tenderness bilaterally in addition to urinary frequency we will go ahead and order a stat KUB to look for kidney stone.

## 2023-06-18 NOTE — Assessment & Plan Note (Signed)
Patient has a history of bacterial vaginosis and feels like this may be another outbreak.  She is wanting medicine today for it.  I will go ahead and send in MetroGel. - Will also get a wet prep to further evaluate.

## 2023-06-18 NOTE — Progress Notes (Signed)
Established patient visit   Patient: Lauren Melton   DOB: September 24, 1986   37 y.o. Female  MRN: [id number] Visit Date: 06/18/2023  Today's healthcare provider: Charlton Amor, DO   Chief Complaint  Patient presents with   Urinary Tract Infection    SUBJECTIVE    Chief Complaint  Patient presents with   Urinary Tract Infection   Urinary Tract Infection     Patient presents with vaginal discharge and low back pain for a few days now.  She is also noting some frequent urination.  She was recently in [place] and sat in the water and feels like something was in the water that altered her pH balance.  She denies any hematuria.  Review of Systems  Constitutional:  Negative for activity change, fatigue and fever.  Respiratory:  Negative for cough and shortness of breath.   Cardiovascular:  Negative for chest pain.  Gastrointestinal:  Negative for abdominal pain.  Genitourinary:  Negative for difficulty urinating.       Current Meds  Medication Sig   bisoprolol-hydrochlorothiazide (ZIAC) 10-6.25 MG tablet Take 1 tablet by mouth daily.   desipramine (NORPRAMIN) 10 MG tablet Take 1 tablet (10 mg total) by mouth at bedtime.   metroNIDAZOLE (METROGEL) 0.75 % gel Apply 1 Application topically 2 (two) times daily.   phentermine (ADIPEX-P) 37.5 MG tablet Take 1 tablet (37.5 mg total) by mouth daily before breakfast.   rizatriptan (MAXALT) 5 MG tablet Take 1 tablet (5 mg total) by mouth as needed for migraine. May repeat in 2 hours if needed   triamcinolone ointment (KENALOG) 0.1 % Apply 1 Application topically 2 (two) times daily. Apply to affected area two times daily for 2 weeks, then nightly for 2 weeks, then 2-3 times a week for 4 weeks.    OBJECTIVE    BP 132/87 (BP Location: Right Arm, Patient Position: Sitting, Cuff Size: Large)   Pulse 74   Resp 16   Ht 5\' 7"  (1.702 m)   Wt (!) 310 lb 4 oz (140.7 kg)   SpO2 100%   BMI 48.59 kg/m   Physical Exam Vitals and nursing  note reviewed.  Constitutional:      General: She is not in acute distress.    Appearance: Normal appearance.  HENT:     Head: Normocephalic and atraumatic.     Right Ear: External ear normal.     Left Ear: External ear normal.     Nose: Nose normal.  Eyes:     Conjunctiva/sclera: Conjunctivae normal.  Cardiovascular:     Rate and Rhythm: Normal rate and regular rhythm.  Pulmonary:     Effort: Pulmonary effort is normal.     Breath sounds: Normal breath sounds.  Abdominal:     Tenderness: There is right CVA tenderness and left CVA tenderness.  Neurological:     General: No focal deficit present.     Mental Status: She is alert and oriented to person, place, and time.  Psychiatric:        Mood and Affect: Mood normal.        Behavior: Behavior normal.        Thought Content: Thought content normal.        Judgment: Judgment normal.       ASSESSMENT & PLAN    Problem List Items Addressed This Visit       Other   Vaginal discharge    Patient has a history of bacterial vaginosis and feels  like this may be another outbreak.  She is wanting medicine today for it.  I will go ahead and send in MetroGel. - Will also get a wet prep to further evaluate.      Relevant Orders   WET PREP FOR TRICH, YEAST, CLUE   Flank pain - Primary    Patient has positive CVA tenderness bilaterally in addition to urinary frequency we will go ahead and order a stat KUB to look for kidney stone.      Relevant Orders   DG Abd 1 View   Other Visit Diagnoses     Dysuria       Relevant Orders   POCT URINALYSIS DIP (CLINITEK) (Completed)   Urine Culture       Return if symptoms worsen or fail to improve.      Meds ordered this encounter  Medications   metroNIDAZOLE (METROGEL) 0.75 % gel    Sig: Apply 1 Application topically 2 (two) times daily.    Dispense:  45 g    Refill:  3    Orders Placed This Encounter  Procedures   Urine Culture   WET PREP FOR TRICH, YEAST, CLUE   DG Abd 1  View    Standing Status:   Future    Standing Expiration Date:   06/17/2024    Order Specific Question:   Reason for Exam (SYMPTOM  OR DIAGNOSIS REQUIRED)    Answer:   flank pain    Order Specific Question:   Is patient pregnant?    Answer:   No    Order Specific Question:   Preferred imaging location?    Answer:   [facility]   POCT URINALYSIS DIP (CLINITEK)     Charlton Amor, DO  [facility] at [facility] [phone] (phone) [phone] (fax)  [facility]

## 2023-06-19 ENCOUNTER — Other Ambulatory Visit: Payer: Self-pay | Admitting: Family Medicine

## 2023-06-19 LAB — URINE CULTURE
MICRO NUMBER:: 15107264
Result:: NO GROWTH
SPECIMEN QUALITY:: ADEQUATE

## 2023-06-19 MED ORDER — METRONIDAZOLE 0.75 % VA GEL: 1.0000 | Freq: Two times a day (BID) | VAGINAL | 0 refills | Status: AC

## 2023-07-22 ENCOUNTER — Ambulatory Visit: Payer: Managed Care, Other (non HMO)

## 2023-07-22 ENCOUNTER — Encounter: Payer: Self-pay | Admitting: Physician Assistant

## 2023-07-22 ENCOUNTER — Ambulatory Visit: Payer: Managed Care, Other (non HMO) | Admitting: Physician Assistant

## 2023-07-22 VITALS — BP 142/81 | HR 86 | Ht 67.0 in | Wt 308.0 lb

## 2023-07-22 DIAGNOSIS — R10819 Abdominal tenderness, unspecified site: Secondary | ICD-10-CM

## 2023-07-22 DIAGNOSIS — R1084 Generalized abdominal pain: Secondary | ICD-10-CM | POA: Diagnosis not present

## 2023-07-22 DIAGNOSIS — R140 Abdominal distension (gaseous): Secondary | ICD-10-CM | POA: Diagnosis not present

## 2023-07-22 DIAGNOSIS — R12 Heartburn: Secondary | ICD-10-CM

## 2023-07-22 DIAGNOSIS — R1013 Epigastric pain: Secondary | ICD-10-CM

## 2023-07-22 DIAGNOSIS — R110 Nausea: Secondary | ICD-10-CM | POA: Diagnosis not present

## 2023-07-22 DIAGNOSIS — R10811 Right upper quadrant abdominal tenderness: Secondary | ICD-10-CM | POA: Diagnosis not present

## 2023-07-22 DIAGNOSIS — R1030 Lower abdominal pain, unspecified: Secondary | ICD-10-CM

## 2023-07-22 DIAGNOSIS — K5901 Slow transit constipation: Secondary | ICD-10-CM

## 2023-07-22 DIAGNOSIS — R108A3 Suprapubic tenderness: Secondary | ICD-10-CM

## 2023-07-22 LAB — POCT URINALYSIS DIP (CLINITEK)
Bilirubin, UA: NEGATIVE
Blood, UA: NEGATIVE
Glucose, UA: NEGATIVE mg/dL
Ketones, POC UA: NEGATIVE mg/dL
Leukocytes, UA: NEGATIVE
Nitrite, UA: NEGATIVE
POC PROTEIN,UA: NEGATIVE
Spec Grav, UA: 1.015 (ref 1.010–1.025)
Urobilinogen, UA: 0.2 E.U./dL
pH, UA: 6 (ref 5.0–8.0)

## 2023-07-22 MED ORDER — POLYETHYLENE GLYCOL 3350 17 G PO PACK
17.0000 g | PACK | Freq: Two times a day (BID) | ORAL | 2 refills | Status: DC
Start: 2023-07-22 — End: 2024-11-09

## 2023-07-22 MED ORDER — ONDANSETRON 8 MG PO TBDP: 8.0000 mg | ORAL_TABLET | Freq: Three times a day (TID) | ORAL | 1 refills | Status: DC | PRN

## 2023-07-22 MED ORDER — OMEPRAZOLE 40 MG PO CPDR: 40.0000 mg | DELAYED_RELEASE_CAPSULE | Freq: Every day | ORAL | 1 refills | Status: DC

## 2023-07-22 NOTE — Progress Notes (Signed)
Acute Office Visit  Subjective:     Patient ID: Lauren Melton, female    DOB: 1986-06-09, 37 y.o.   MRN: [id number]  Chief Complaint  Patient presents with   Abdominal Pain    Pt has had abdominal pain which began about 1 week ago. Did have some nausea as well and also heartburn. States that she has also had some dark stools, some diarrhea, and has felt bloated.    HPI Patient is in today for generalized abdominal pain that started about a week ago with nausea, heartburn and stool changes. Pt denies any fever but has had some hot flashes. She is not vomiting but food and drink make her nauseated. She has upper quadrant and suprapubic pain. She admits she has been taking more ibuprofen after recent illness. She did take some antacid and then noticed more loose stools, from her hx of constipation, and black in color.   .. 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   Class 3 severe obesity due to excess calories without serious comorbidity with body mass index (BMI) of 45.0 to 49.9 in adult (HCC) 08/27/2016   Vitamin D insufficiency 08/29/2016   Slow transit constipation 05/26/2018   Acute stress reaction 09/09/2018   Chronic tension-type headache, not intractable 08/02/2019   Snoring 08/02/2019   Non-restorative sleep 08/02/2019   Grief reaction 04/17/2020   Acute bilateral low back pain without sciatica 04/17/2020   Migraine without aura and without status migrainosus, not intractable 03/19/2021   Depressed mood 03/19/2021   Acute pain of left knee 09/23/2021   Left-sided chest wall pain 02/24/2022   Palpitations 02/24/2022   Neck pain 02/24/2022   Frequent headaches 02/24/2022   Mid back pain 02/24/2022   Breast tenderness 09/30/2022   Dysmenorrhea 09/30/2022   Vagina itching 09/30/2022   Suprapubic pain 10/27/2022   Clitoral irritation 01/06/2023    Pelvic pressure in female 01/06/2023   Left hip pain 03/02/2023   Recent skin changes 03/02/2023   Bilateral primary osteoarthritis of hip 03/03/2023   Group B streptococcal bacteriuria 04/10/2023   Vaginal discharge 04/11/2023   Vulvodynia 04/20/2023   Flank pain 06/18/2023   Generalized abdominal pain 07/22/2023   Bloated abdomen 07/22/2023   Suprapubic tenderness 07/22/2023   Right upper quadrant abdominal tenderness without rebound tenderness 07/22/2023   Nausea 07/22/2023   Resolved Ambulatory Problems    Diagnosis Date Noted   Cesarean delivery delivered 01/14/2016   No energy 08/27/2016   New daily persistent headache 07/27/2019   Sore throat 07/29/2020   Complicated grief 09/11/2020   Cloudy urine 09/30/2022   Dysuria 10/27/2022   Past Medical History:  Diagnosis Date   HSV-2 seropositive    Hypertension      ROS See HPI.      Objective:    BP (!) 142/81 (BP Location: Left Arm, Patient Position: Sitting, Cuff Size: Large)   Pulse 86   Ht 5\' 7"  (1.702 m)   Wt (!) 308 lb (139.7 kg)   LMP 06/28/2023 (Approximate)   SpO2 100%   BMI 48.24 kg/m  BP Readings from Last 3 Encounters:  07/22/23 (!) 142/81  06/18/23 132/87  04/20/23 132/84   Wt Readings from Last 3 Encounters:  07/22/23 (!) 308 lb (139.7 kg)  06/18/23 (!) 310 lb 4 oz (140.7 kg)  04/20/23 (!) 309 lb (140.2 kg)  Physical Exam Constitutional:      Appearance: She is well-developed. She is obese.  HENT:     Head: Normocephalic.  Abdominal:     General: Bowel sounds are normal. There is no distension. There are no signs of injury.     Palpations: Abdomen is soft.     Tenderness: There is generalized abdominal tenderness and tenderness in the right upper quadrant and suprapubic area. There is no right CVA tenderness, left CVA tenderness, guarding or rebound. Negative signs include Murphy's sign, Rovsing's sign, McBurney's sign, psoas sign and obturator sign.     Hernia: No hernia is  present.  Neurological:     General: No focal deficit present.     Mental Status: She is alert.     Results for orders placed or performed in visit on 07/22/23  POCT URINALYSIS DIP (CLINITEK)  Result Value Ref Range   Color, UA yellow yellow   Clarity, UA cloudy (A) clear   Glucose, UA negative negative mg/dL   Bilirubin, UA negative negative   Ketones, POC UA negative negative mg/dL   Spec Grav, UA 1.610 9.604 - 1.025   Blood, UA negative negative   pH, UA 6.0 5.0 - 8.0   POC PROTEIN,UA negative negative, trace   Urobilinogen, UA 0.2 0.2 or 1.0 E.U./dL   Nitrite, UA Negative Negative   Leukocytes, UA Negative Negative        Assessment & Plan:  [name] KitchenMarland KitchenSamathia "Sam" was seen today for abdominal pain.  Diagnoses and all orders for this visit:  Generalized abdominal pain -     Lipase -     CMP14+EGFR -     CBC w/Diff/Platelet -     US Abdomen Complete; Future -     Urine Culture -     POCT URINALYSIS DIP (CLINITEK)  Nausea -     Lipase -     CMP14+EGFR -     CBC w/Diff/Platelet -     US Abdomen Complete; Future -     ondansetron (ZOFRAN-ODT) 8 MG disintegrating tablet; Take 1 tablet (8 mg total) by mouth every 8 (eight) hours as needed.  Right upper quadrant abdominal tenderness without rebound tenderness -     Lipase -     CMP14+EGFR -     CBC w/Diff/Platelet -     US Abdomen Complete; Future -     omeprazole (PRILOSEC) 40 MG capsule; Take 1 capsule (40 mg total) by mouth daily. -     Urine Culture -     POCT URINALYSIS DIP (CLINITEK)  Suprapubic tenderness -     Lipase -     CMP14+EGFR -     CBC w/Diff/Platelet -     US Abdomen Complete; Future -     Urine Culture -     POCT URINALYSIS DIP (CLINITEK)  Bloated abdomen -     omeprazole (PRILOSEC) 40 MG capsule; Take 1 capsule (40 mg total) by mouth daily. -     Urine Culture -     POCT URINALYSIS DIP (CLINITEK)  Slow transit constipation -     polyethylene glycol (MIRALAX / GLYCOLAX) 17 g packet; Take 17  g by mouth 2 (two) times daily. Until stooling regularly  Heartburn -     omeprazole (PRILOSEC) 40 MG capsule; Take 1 capsule (40 mg total) by mouth daily.   Unclear etiology of pain gastroenteritis vs gastritis vs pancreatitis vs PUD vs cholecystitis vs colitis vs UTI vs diverticulitis.  UA negative for  blood, nitrites, leukocytes or protein. Less likely UTI.  Will culture.  Will get CBC/CMP/lipase Stat US- unremarkable Will wait to get labs back to consider CT of abdomen.  Start with zofran for nausea and omeprazole for gastritis and heartburn Stop any NSAIDs and keep bland diet Will follow closely.    Return in about 4 weeks (around 08/19/2023), or abdominal pain.  Tandy Gaw, PA-C

## 2023-07-22 NOTE — Progress Notes (Signed)
No gallstones or inflammation of gallbladder.  No abnormalities found.   To get a better look at colon will need to proceed to CT. Lets wait until labs come back to see if they give [place] any new information.   Take zofran for nausea.  Take omeprazole for heart burn.  Avoid any NSAIDS and keep a bland diet.

## 2023-07-22 NOTE — Patient Instructions (Signed)
Gastritis, Adult Gastritis is inflammation of the stomach. There are two kinds of gastritis: Acute gastritis. This kind develops suddenly. Chronic gastritis. This kind is much more common. It develops slowly and lasts for a long time. Gastritis happens when the lining of the stomach becomes weak or gets damaged. Without treatment, gastritis can lead to stomach bleeding and ulcers. What are the causes? This condition may be caused by: An infection. Drinking too much alcohol. Certain medicines. These include steroids, antibiotics, and some over-the-counter medicines, such as aspirin or ibuprofen. Having too much acid in the stomach. Having a disease of the stomach. Other causes may include: An allergic reaction. Some cancer treatments (radiation). Smoking cigarettes or the use of products that contain nicotine or tobacco. In some cases, the cause of this condition is not known. What increases the risk? Having a disease of the intestines. Having a disease in which the body's immune system attacks the body (autoimmune disease), such as Crohn's disease. Using aspirin or ibuprofen and other NSAIDs to treat other conditions, such as heart disease or chronic pain. Stress. What are the signs or symptoms? Symptoms of this condition include: Pain or a burning sensation in the upper abdomen. Nausea. Vomiting. An uncomfortable feeling of fullness after eating. Weight loss. Bad breath. Blood in your vomit or stool (feces). In some cases, there are no symptoms. How is this diagnosed? This condition may be diagnosed based on your medical history, a physical exam, and tests. Tests may include: Your medical history and a description of your symptoms. A physical exam. Tests. These can include: Blood tests. Stool tests. A test in which a thin, flexible instrument with a light and a camera is passed down the esophagus and into the stomach (upper endoscopy). A test in which a tissue sample is  removed to look at it under a microscope (biopsy). How is this treated? This condition may be treated with medicines. The medicines that are used vary depending on the cause of the gastritis. If the condition is caused by a bacterial infection, you may be given antibiotic medicines. If the condition is caused by too much acid in the stomach, you may be given medicines called H2 blockers, proton pump inhibitors, or antacids. Treatment may also involve stopping the use of certain medicines such as aspirin or ibuprofen and other NSAIDs. Follow these instructions at home: Medicines Take over-the-counter and prescription medicines only as told by your health care provider. If you were prescribed an antibiotic medicine, take it as told by your health care provider. Do not stop taking the antibiotic even if you start to feel better. Alcohol use Do not drink alcohol if: Your health care provider tells you not to drink. You are pregnant, may be pregnant, or are planning to become pregnant. If you drink alcohol: Limit your use to: 0-1 drink a day for women. 0-2 drinks a day for men. Know how much alcohol is in your drink. In the [place] one drink equals one 12 oz bottle of beer (355 mL), one 5 oz glass of wine (148 mL), or one 1 oz glass of hard liquor (44 mL). General instructions  Eat small, frequent meals instead of large meals. Avoid foods and drinks that make your symptoms worse. Talk with your health care provider about ways to manage stress, such as getting regular exercise or practicing deep breathing, meditation, or yoga. Do not use any products that contain nicotine or tobacco. These products include cigarettes, chewing tobacco, and vaping devices, such as e-cigarettes.   If you need help quitting, ask your health care provider. Drink enough fluid to keep your urine pale yellow. Keep all follow-up visits. This is important. Contact a health care provider if: Your symptoms get worse. Your  abdominal pain gets worse. Your symptoms return after treatment. You have a fever. Get help right away if: You vomit blood or a substance that looks like coffee grounds. You have black or dark red stools. You are unable to keep fluids down. These symptoms may represent a serious problem that is an emergency. Do not wait to see if the symptoms will go away. Get medical help right away. Call your local emergency services (911 in the [place] Do not drive yourself to the hospital. Summary Gastritis is inflammation of the lining of the stomach that can occur suddenly (acute) or develop slowly over time (chronic). This condition is diagnosed with a medical history, a physical exam, or tests. This condition may be treated with medicines to treat infection or medicines to reduce the amount of acid in your stomach. Follow your health care provider's instructions about taking medicines, making changes to your diet, and knowing when to call for help. This information is not intended to replace advice given to you by your health care provider. Make sure you discuss any questions you have with your health care provider. Document Revised: 04/20/2021 Document Reviewed: 04/20/2021 Elsevier Patient Education  2024 [employer].

## 2023-07-23 LAB — CMP14+EGFR
AST: 16 IU/L (ref 0–40)
Albumin: 4.3 g/dL (ref 3.9–4.9)
Alkaline Phosphatase: 86 IU/L (ref 44–121)
BUN/Creatinine Ratio: 15 (ref 9–23)
BUN: 10 mg/dL (ref 6–20)
Bilirubin Total: 0.4 mg/dL (ref 0.0–1.2)
CO2: 22 mmol/L (ref 20–29)
Calcium: 9.6 mg/dL (ref 8.7–10.2)
Chloride: 99 mmol/L (ref 96–106)
Creatinine, Ser: 0.67 mg/dL (ref 0.57–1.00)
Globulin, Total: 3 g/dL (ref 1.5–4.5)
Potassium: 4.5 mmol/L (ref 3.5–5.2)
Sodium: 135 mmol/L (ref 134–144)
Total Protein: 7.3 g/dL (ref 6.0–8.5)
eGFR: 115 mL/min/{1.73_m2} (ref >59)

## 2023-07-23 LAB — CBC WITH DIFFERENTIAL/PLATELET
Basophils Absolute: 0.1 10*3/uL (ref 0.0–0.2)
Basos: 1 %
Eos: 3 %
Hematocrit: 36.5 % (ref 34.0–46.6)
Hemoglobin: 12.4 g/dL (ref 11.1–15.9)
Immature Grans (Abs): 0 10*3/uL (ref 0.0–0.1)
Immature Granulocytes: 0 %
Lymphocytes Absolute: 2.8 10*3/uL (ref 0.7–3.1)
MCH: 28.4 pg (ref 26.6–33.0)
MCHC: 34 g/dL (ref 31.5–35.7)
MCV: 84 fL (ref 79–97)
Monocytes: 6 %
Neutrophils Absolute: 2.8 10*3/uL (ref 1.4–7.0)
Neutrophils: 45 %
Platelets: 407 10*3/uL (ref 150–450)
RBC: 4.37 x10E6/uL (ref 3.77–5.28)
RDW: 14.5 % (ref 11.7–15.4)
WBC: 6.2 10*3/uL (ref 3.4–10.8)

## 2023-07-23 LAB — LIPASE: Lipase: 20 U/L (ref 14–72)

## 2023-07-24 ENCOUNTER — Telehealth: Payer: Self-pay

## 2023-07-24 ENCOUNTER — Ambulatory Visit (INDEPENDENT_AMBULATORY_CARE_PROVIDER_SITE_OTHER): Payer: Managed Care, Other (non HMO)

## 2023-07-24 DIAGNOSIS — R1031 Right lower quadrant pain: Secondary | ICD-10-CM | POA: Diagnosis not present

## 2023-07-24 DIAGNOSIS — R1084 Generalized abdominal pain: Secondary | ICD-10-CM | POA: Diagnosis not present

## 2023-07-24 DIAGNOSIS — R10819 Abdominal tenderness, unspecified site: Secondary | ICD-10-CM

## 2023-07-24 DIAGNOSIS — R1030 Lower abdominal pain, unspecified: Secondary | ICD-10-CM

## 2023-07-24 DIAGNOSIS — R110 Nausea: Secondary | ICD-10-CM

## 2023-07-24 DIAGNOSIS — R10811 Right upper quadrant abdominal tenderness: Secondary | ICD-10-CM

## 2023-07-24 DIAGNOSIS — R12 Heartburn: Secondary | ICD-10-CM

## 2023-07-24 DIAGNOSIS — R140 Abdominal distension (gaseous): Secondary | ICD-10-CM

## 2023-07-24 MED ORDER — IOHEXOL 300 MG/ML  SOLN
100.0000 mL | Freq: Once | INTRAMUSCULAR | Status: AC | PRN
  Administered 2023-07-24: 100 mL via INTRAVENOUS

## 2023-07-24 NOTE — Telephone Encounter (Unsigned)
Called the pt to see if she could come in to get her labs drawn, pt was agreeable to come in to get lab done next day.

## 2023-07-24 NOTE — Addendum Note (Signed)
Addended by: Jomarie Longs on: 07/24/2023 11:45 AM   Modules accepted: Orders

## 2023-07-24 NOTE — Progress Notes (Signed)
Sam,   No gallstones or inflamed pancreas.  No inflammatory changes of colon.  Small umbilical hernia but should not be causing the pain.   Looks like it is gastric ulcer. It can take 4-6 weeks to heal. Small chance but chance it is h.pylori. can you come by and do a breath test today.   I sent omeprazole to start and can combine with OTC pepcid twice a day.  Miralax packets up to twice a day for constipation.  Zofran for nausea as needed.  BRAT very bland diet and no NSAIDs.

## 2023-07-24 NOTE — Progress Notes (Signed)
No bacteria in UA culture. If still having pain suggest CT scan.

## 2023-07-24 NOTE — Progress Notes (Signed)
STAT CT just ordered for patient to be done today.

## 2023-07-29 NOTE — Progress Notes (Signed)
No abnormal bacteria found in breath test.

## 2023-07-29 NOTE — Addendum Note (Signed)
Addended by: Jomarie Longs on: 07/29/2023 03:30 PM   Modules accepted: Orders

## 2023-08-19 ENCOUNTER — Other Ambulatory Visit: Payer: Self-pay | Admitting: Obstetrics & Gynecology

## 2023-09-11 ENCOUNTER — Encounter: Payer: Self-pay | Admitting: Pharmacist

## 2023-09-16 ENCOUNTER — Encounter: Payer: Self-pay | Admitting: Physician Assistant

## 2023-09-16 ENCOUNTER — Ambulatory Visit (INDEPENDENT_AMBULATORY_CARE_PROVIDER_SITE_OTHER): Payer: Managed Care, Other (non HMO) | Admitting: Physician Assistant

## 2023-09-16 VITALS — BP 127/73 | HR 77 | Resp 20 | Ht 67.0 in | Wt 306.7 lb

## 2023-09-16 DIAGNOSIS — N94819 Vulvodynia, unspecified: Secondary | ICD-10-CM | POA: Diagnosis not present

## 2023-09-16 DIAGNOSIS — Z0000 Encounter for general adult medical examination without abnormal findings: Secondary | ICD-10-CM | POA: Diagnosis not present

## 2023-09-16 DIAGNOSIS — I10 Essential (primary) hypertension: Secondary | ICD-10-CM | POA: Diagnosis not present

## 2023-09-16 DIAGNOSIS — R12 Heartburn: Secondary | ICD-10-CM

## 2023-09-16 DIAGNOSIS — Z6841 Body Mass Index (BMI) 40.0 and over, adult: Secondary | ICD-10-CM

## 2023-09-16 DIAGNOSIS — R1013 Epigastric pain: Secondary | ICD-10-CM

## 2023-09-16 MED ORDER — BISOPROLOL-HYDROCHLOROTHIAZIDE 10-6.25 MG PO TABS: 1.0000 | ORAL_TABLET | Freq: Every day | ORAL | 1 refills | Status: DC

## 2023-09-16 MED ORDER — DESIPRAMINE HCL 10 MG PO TABS
10.0000 mg | ORAL_TABLET | Freq: Every day | ORAL | 3 refills | Status: DC
Start: 2023-09-16 — End: 2024-09-20

## 2023-09-17 ENCOUNTER — Encounter: Payer: Self-pay | Admitting: Physician Assistant

## 2023-10-01 ENCOUNTER — Encounter: Payer: Self-pay | Admitting: Physician Assistant

## 2023-10-01 ENCOUNTER — Telehealth: Payer: Self-pay | Admitting: Physician Assistant

## 2023-10-01 NOTE — Telephone Encounter (Signed)
Patient came into the office stating that her visit from 9/18 was charged as a Paramedic Visit instead of a Physical. Can we fix this please?

## 2023-10-02 ENCOUNTER — Encounter: Payer: Self-pay | Admitting: Physician Assistant

## 2023-10-02 NOTE — Addendum Note (Signed)
Addended by: Jomarie Longs on: 10/02/2023 12:11 PM   Modules accepted: Level of Service

## 2023-10-02 NOTE — Telephone Encounter (Signed)
Changed.

## 2024-02-15 ENCOUNTER — Ambulatory Visit: Payer: Self-pay | Admitting: Physician Assistant

## 2024-02-15 NOTE — Telephone Encounter (Signed)
 Copied from CRM [id number]. Topic: Clinical - Red Word Triage >> Feb 15, 2024 11:06 AM Elle L wrote: Red Word that prompted transfer to Nurse Triage: The patient believes she may have a sinus infection that started Saturday. She has a headache, sneezing, and discolored mucus that is causing pain and pressure.   Chief Complaint: Sinus pressure  Symptoms: Sinus pressure, sneezing, cough, ear pain, sore throat Frequency: Constant  Pertinent Negatives: Patient denies fever  Disposition: [] ED /[] Urgent Care (no appt availability in office) / [x] Appointment(In office/virtual)/ []  [facility]/ [] Home Care/ [] Refused Recommended Disposition /[] [facility]/ []  Follow-up with PCP Additional Notes: Patient reports she has been experiencing sinus pressure and congestion for the last 2 day. She states she has also been experiencing a sore throat, cough, sneezing, and ear pain. She denies any fevers. Appointment made in the office for tomorrow.     Reason for Disposition  Earache  Answer Assessment - Initial Assessment Questions 1. LOCATION: "Where does it hurt?"      Sinus pressure and discomfort  2. ONSET: "When did the sinus pain start?"  (e.g., hours, days)      2 days ago  3. SEVERITY: "How bad is the pain?"   (Scale 1-10; mild, moderate or severe)   - MILD (1-3): doesn't interfere with normal activities    - MODERATE (4-7): interferes with normal activities (e.g., work or school) or awakens from sleep   - SEVERE (8-10): excruciating pain and patient unable to do any normal activities        Moderate  4. RECURRENT SYMPTOM: "Have you ever had sinus problems before?" If Yes, ask: "When was the last time?" and "What happened that time?"      Yes 5. NASAL CONGESTION: "Is the nose blocked?" If Yes, ask: "Can you open it or must you breathe through your mouth?"     Yes 6. NASAL DISCHARGE: "Do you have discharge from your nose?" If so ask, "What color?"    Clear 7. FEVER: "Do you  have a fever?" If Yes, ask: "What is it, how was it measured, and when did it start?"      No 8. OTHER SYMPTOMS: "Do you have any other symptoms?" (e.g., sore throat, cough, earache, difficulty breathing)     Cough with greenish/yellow sputum, sore throat, earache  9. PREGNANCY: "Is there any chance you are pregnant?" "When was your last menstrual period?"     No  Protocols used: Sinus Pain or Congestion-A-AH

## 2024-02-16 ENCOUNTER — Encounter: Payer: Self-pay | Admitting: Physician Assistant

## 2024-02-16 ENCOUNTER — Ambulatory Visit: Payer: Managed Care, Other (non HMO) | Admitting: Physician Assistant

## 2024-02-16 VITALS — BP 157/94 | HR 83 | Ht 67.0 in | Wt 311.8 lb

## 2024-02-16 DIAGNOSIS — J3489 Other specified disorders of nose and nasal sinuses: Secondary | ICD-10-CM | POA: Diagnosis not present

## 2024-02-16 DIAGNOSIS — R52 Pain, unspecified: Secondary | ICD-10-CM | POA: Diagnosis not present

## 2024-02-16 DIAGNOSIS — B349 Viral infection, unspecified: Secondary | ICD-10-CM | POA: Diagnosis not present

## 2024-02-16 DIAGNOSIS — R051 Acute cough: Secondary | ICD-10-CM

## 2024-02-16 LAB — POCT INFLUENZA A/B
Influenza A, POC: NEGATIVE
Influenza B, POC: NEGATIVE

## 2024-02-16 LAB — POC COVID19 BINAXNOW: SARS Coronavirus 2 Ag: NEGATIVE

## 2024-02-16 MED ORDER — METHYLPREDNISOLONE 4 MG PO TBPK: ORAL_TABLET | ORAL | 0 refills | Status: DC

## 2024-02-16 NOTE — Patient Instructions (Signed)

## 2024-02-16 NOTE — Progress Notes (Signed)
   Acute Office Visit  Subjective:     Patient ID: Lauren Melton, female    DOB: February 19, 1986, 38 y.o.   MRN: [id number]  HPI Patient is a 38 year old female who presents with a chief complaint of sinus congestion and cough. Onset x2 days ago. Endorses ear pain, body aches, and sinus pressure. Denies fever, chills, shortness of breath. She has taken Alkazelzer and Dayquil to help with her sxs. She states she had a swollen lymph node but it went away. Positive sick contacts with her son.   Review of Systems  Constitutional:  Positive for malaise/fatigue.  HENT:  Positive for congestion and sinus pain.   Respiratory:  Positive for cough.   Musculoskeletal:  Positive for myalgias.      Objective:    There were no vitals taken for this visit. BP Readings from Last 3 Encounters:  02/16/24 (!) 157/94  09/16/23 127/73  07/22/23 (!) 142/81   Wt Readings from Last 3 Encounters:  02/16/24 (!) 141.4 kg  09/16/23 (!) 139.1 kg  07/22/23 (!) 139.7 kg   Physical Exam Constitutional:      General: She is not in acute distress.    Appearance: She is obese.  HENT:     Head: Normocephalic and atraumatic.     Right Ear: Tympanic membrane normal.     Left Ear: There is impacted cerumen.     Ears:     Comments: Redness and fluid behind the right TM    Mouth/Throat:     Mouth: Mucous membranes are moist.     Pharynx: Oropharynx is clear. No oropharyngeal exudate or posterior oropharyngeal erythema.  Eyes:     Extraocular Movements: Extraocular movements intact.  Neck:     Comments: Tenderness on the left side of her neck Cardiovascular:     Rate and Rhythm: Normal rate and regular rhythm.     Pulses: Normal pulses.     Heart sounds: Normal heart sounds.  Pulmonary:     Effort: Pulmonary effort is normal.     Breath sounds: Normal breath sounds.  Musculoskeletal:     Cervical back: Tenderness present.  Neurological:     Mental Status: She is alert.       Assessment & Plan:    Problem List Items Addressed This Visit   None - POCT influenza A/B in office today - POC COVID testing in office today  Ilean China, [address]

## 2024-02-17 ENCOUNTER — Encounter: Payer: Self-pay | Admitting: Physician Assistant

## 2024-02-19 ENCOUNTER — Encounter: Payer: Self-pay | Admitting: Physician Assistant

## 2024-02-19 MED ORDER — AMOXICILLIN-POT CLAVULANATE 875-125 MG PO TABS: 1.0000 | ORAL_TABLET | Freq: Two times a day (BID) | ORAL | 0 refills | Status: DC

## 2024-07-20 ENCOUNTER — Ambulatory Visit: Admitting: Physician Assistant

## 2024-07-20 VITALS — BP 153/73 | HR 77 | Temp 98.9°F | Ht 67.0 in | Wt 315.0 lb

## 2024-07-20 DIAGNOSIS — M545 Low back pain, unspecified: Secondary | ICD-10-CM | POA: Diagnosis not present

## 2024-07-20 DIAGNOSIS — N898 Other specified noninflammatory disorders of vagina: Secondary | ICD-10-CM

## 2024-07-20 DIAGNOSIS — R300 Dysuria: Secondary | ICD-10-CM | POA: Diagnosis not present

## 2024-07-20 LAB — POCT URINALYSIS DIP (CLINITEK)
Bilirubin, UA: NEGATIVE
Blood, UA: NEGATIVE
Glucose, UA: NEGATIVE mg/dL
Ketones, POC UA: NEGATIVE mg/dL
Leukocytes, UA: NEGATIVE
Nitrite, UA: NEGATIVE
POC PROTEIN,UA: NEGATIVE
Spec Grav, UA: 1.015 (ref 1.010–1.025)
Urobilinogen, UA: 0.2 U/dL
pH, UA: 6.5 (ref 5.0–8.0)

## 2024-07-20 MED ORDER — FLUCONAZOLE 150 MG PO TABS: ORAL_TABLET | ORAL | 0 refills | Status: DC

## 2024-07-20 NOTE — Progress Notes (Signed)
 Acute Office Visit  Subjective:     Patient ID: Lauren Melton, female    DOB: 11-Jun-1986, 38 y.o.   MRN: [id number]  Chief Complaint  Patient presents with   Dysuria    HPI Patient is in today for dysuria and vaginal irritation with low back pain for past 2 days. She has hx of BV and yeast infections and wants to make sure she does not have anything. She denies any fever, chills, nausea, vomiting. She did start some metronidazole  that she had at home. She is having some discharge that is clear to gray with no odor.   She denies any reason for low back pain such as injury or trauma.   ROS See HPI.      Objective:    BP (!) 153/73   Pulse 77   Temp 98.9 F (37.2 C) (Oral)   Ht 5' 7 (1.702 m)   Wt (!) 315 lb (142.9 kg)   SpO2 99%   BMI 49.34 kg/m  BP Readings from Last 3 Encounters:  07/20/24 (!) 153/73  02/16/24 (!) 157/94  09/16/23 127/73   Wt Readings from Last 3 Encounters:  07/20/24 (!) 315 lb (142.9 kg)  02/16/24 (!) 311 lb 12 oz (141.4 kg)  09/16/23 (!) 306 lb 11.2 oz (139.1 kg)      Physical Exam Constitutional:      Appearance: Normal appearance. She is obese.  HENT:     Head: Normocephalic.  Cardiovascular:     Rate and Rhythm: Normal rate and regular rhythm.  Pulmonary:     Effort: Pulmonary effort is normal.  Abdominal:     General: There is no distension.     Palpations: Abdomen is soft. There is no mass.     Tenderness: There is no abdominal tenderness. There is no right CVA tenderness, left CVA tenderness or guarding.  Musculoskeletal:     Right lower leg: No edema.     Left lower leg: No edema.  Neurological:     General: No focal deficit present.     Mental Status: She is alert.  Psychiatric:        Mood and Affect: Mood normal.     .. Results for orders placed or performed in visit on 07/20/24  NuSwab Vaginitis Plus (VG+)   Collection Time: 07/20/24  2:57 PM  Result Value Ref Range   Atopobium vaginae Low - 0 Score    BVAB 2 Low - 0 Score   Megasphaera 1 Low - 0 Score   Candida albicans, NAA Negative Negative   Candida glabrata, NAA Negative Negative   Trich vag by NAA Negative Negative   Chlamydia trachomatis, NAA Negative Negative   Neisseria gonorrhoeae, NAA Negative Negative  Specimen status report   Collection Time: 07/20/24  2:57 PM  Result Value Ref Range   specimen status report Comment   Urine Culture   Collection Time: 07/20/24  2:58 PM   GYN  Result Value Ref Range   Urine Culture, Routine Final report    Organism ID, Bacteria Comment   Specimen status report   Collection Time: 07/20/24  2:58 PM  Result Value Ref Range   specimen status report Comment   POCT URINALYSIS DIP (CLINITEK)   Collection Time: 07/20/24  3:32 PM  Result Value Ref Range   Color, UA yellow yellow   Clarity, UA clear clear   Glucose, UA negative negative mg/dL   Bilirubin, UA negative negative   Ketones, POC UA negative negative  mg/dL   Spec Grav, UA 8.984 8.989 - 1.025   Blood, UA negative negative   pH, UA 6.5 5.0 - 8.0   POC PROTEIN,UA negative negative, trace   Urobilinogen, UA 0.2 0.2 or 1.0 E.U./dL   Nitrite, UA Negative Negative   Leukocytes, UA Negative Negative         Assessment & Plan:  SABRASABRAGaynelle Melton was seen today for dysuria.  Diagnoses and all orders for this visit:  Dysuria -     POCT URINALYSIS DIP (CLINITEK) -     NuSwab Vaginitis Plus (VG+) -     Urine Culture -     fluconazole  (DIFLUCAN ) 150 MG tablet; Take one tablet now and then repeat in 48 hours. -     NuSwab Vaginitis Plus (VG+) -     Specimen status report -     Urine Culture -     Specimen status report  Vaginal irritation -     fluconazole  (DIFLUCAN ) 150 MG tablet; Take one tablet now and then repeat in 48 hours. -     NuSwab Vaginitis Plus (VG+) -     Specimen status report -     Urine Culture -     Specimen status report  Acute bilateral low back pain without sciatica -     POCT URINALYSIS DIP  (CLINITEK) -     NuSwab Vaginitis Plus (VG+) -     Urine Culture -     fluconazole  (DIFLUCAN ) 150 MG tablet; Take one tablet now and then repeat in 48 hours. -     NuSwab Vaginitis Plus (VG+) -     Specimen status report -     Urine Culture -     Specimen status report   UA done in office with no concerns of infection Vaginal testing for yeast, BV, STIs ordered today Urine culture ordered Sent diflucan  to take once while waiting for results Heating pad and low back stretches for pain If symptoms continue or worsen please reach out Stay hydrated.   Jailen Lung, PA-C

## 2024-07-22 ENCOUNTER — Ambulatory Visit: Payer: Self-pay | Admitting: Physician Assistant

## 2024-07-22 ENCOUNTER — Encounter: Payer: Self-pay | Admitting: Physician Assistant

## 2024-07-22 NOTE — Progress Notes (Signed)
 Hi Lauren Melton, so far negative for BV and yeast.  Additional test still pending.  Urine sample also looks okay so no sign of UTI but still awaiting culture for confirmation.

## 2024-07-23 LAB — SPECIMEN STATUS REPORT

## 2024-07-23 LAB — NUSWAB VAGINITIS PLUS (VG+)
Candida albicans, NAA: NEGATIVE
Candida glabrata, NAA: NEGATIVE
Chlamydia trachomatis, NAA: NEGATIVE
Neisseria gonorrhoeae, NAA: NEGATIVE
Trich vag by NAA: NEGATIVE

## 2024-07-23 LAB — URINE CULTURE

## 2024-07-25 ENCOUNTER — Encounter: Payer: Self-pay | Admitting: Physician Assistant

## 2024-07-25 MED ORDER — PHENAZOPYRIDINE HCL 200 MG PO TABS: 200.0000 mg | ORAL_TABLET | Freq: Three times a day (TID) | ORAL | 0 refills | Status: AC

## 2024-07-25 NOTE — Progress Notes (Signed)
 Sam,   No significant bacteria growth on urine culture.  No bacterial vaginosis.  No yeast detected.  Are you still having symptoms and pain?

## 2024-07-25 NOTE — Telephone Encounter (Signed)
 Please see mychart message sent by pt and advise.

## 2024-07-25 NOTE — Telephone Encounter (Signed)
 The breast tissue is definitely a little unusual I would feel more comfortable with her coming in for exam and follow-up especially since she is 38 years old.  Lauren Melton schedule with [name] either this week or next week.

## 2024-07-26 NOTE — Telephone Encounter (Signed)
 Patient scheduled.

## 2024-07-27 NOTE — Telephone Encounter (Signed)
 She would need a pelvic exam. We can schedule

## 2024-08-02 ENCOUNTER — Ambulatory Visit: Admitting: Physician Assistant

## 2024-09-20 ENCOUNTER — Ambulatory Visit (INDEPENDENT_AMBULATORY_CARE_PROVIDER_SITE_OTHER): Admitting: Physician Assistant

## 2024-09-20 ENCOUNTER — Encounter: Payer: Self-pay | Admitting: Physician Assistant

## 2024-09-20 VITALS — BP 140/95 | HR 70 | Ht 65.0 in | Wt 315.0 lb

## 2024-09-20 DIAGNOSIS — N94819 Vulvodynia, unspecified: Secondary | ICD-10-CM

## 2024-09-20 DIAGNOSIS — Z114 Encounter for screening for human immunodeficiency virus [HIV]: Secondary | ICD-10-CM

## 2024-09-20 DIAGNOSIS — R7301 Impaired fasting glucose: Secondary | ICD-10-CM

## 2024-09-20 DIAGNOSIS — Z0000 Encounter for general adult medical examination without abnormal findings: Secondary | ICD-10-CM | POA: Diagnosis not present

## 2024-09-20 DIAGNOSIS — Z13220 Encounter for screening for lipoid disorders: Secondary | ICD-10-CM

## 2024-09-20 DIAGNOSIS — Z79899 Other long term (current) drug therapy: Secondary | ICD-10-CM

## 2024-09-20 DIAGNOSIS — I10 Essential (primary) hypertension: Secondary | ICD-10-CM

## 2024-09-20 DIAGNOSIS — E66813 Obesity, class 3: Secondary | ICD-10-CM | POA: Diagnosis not present

## 2024-09-20 DIAGNOSIS — E559 Vitamin D deficiency, unspecified: Secondary | ICD-10-CM

## 2024-09-20 DIAGNOSIS — Z833 Family history of diabetes mellitus: Secondary | ICD-10-CM

## 2024-09-20 DIAGNOSIS — Z6841 Body Mass Index (BMI) 40.0 and over, adult: Secondary | ICD-10-CM

## 2024-09-20 MED ORDER — DESIPRAMINE HCL 10 MG PO TABS: 10.0000 mg | ORAL_TABLET | Freq: Every day | ORAL | 3 refills | Status: AC

## 2024-09-20 MED ORDER — BISOPROLOL-HYDROCHLOROTHIAZIDE 10-6.25 MG PO TABS: 1.0000 | ORAL_TABLET | Freq: Every day | ORAL | 1 refills | Status: AC

## 2024-09-20 NOTE — Progress Notes (Signed)
 Complete physical exam  Patient: Lauren Melton   DOB: 12-07-1986   38 y.o. Female  MRN: [id number]  Subjective:    No chief complaint on file.   Lauren Melton is a 37 y.o. female who presents today for a complete physical exam. She reports consuming a general diet. She has just started walking.  She generally feels fairly well. She reports sleeping fairly well. She does  not have additional problems to discuss today.    Most recent fall risk assessment:    09/26/2024    4:59 AM  Fall Risk   Falls in the past year? 0  Number falls in past yr: 0  Injury with Fall? 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   Vaginal discharge 04/11/2023   Group B streptococcal bacteriuria 04/10/2023   Bilateral primary osteoarthritis of hip 03/03/2023   Left hip pain 03/02/2023   Recent skin changes 03/02/2023   Clitoral irritation 01/06/2023   Pelvic pressure in female 01/06/2023   Suprapubic pain 10/27/2022   Breast tenderness 09/30/2022   Dysmenorrhea 09/30/2022   Vagina itching 09/30/2022   Left-sided chest wall pain 02/24/2022   Palpitations 02/24/2022   Neck pain 02/24/2022   Frequent headaches 02/24/2022   Mid back pain 02/24/2022   Acute pain of left knee 09/23/2021   Migraine without aura and without status migrainosus, not intractable 03/19/2021   Depressed mood 03/19/2021   Grief reaction 04/17/2020   Acute bilateral low back pain without  sciatica 04/17/2020   Chronic tension-type headache, not intractable 08/02/2019   Snoring 08/02/2019   Non-restorative sleep 08/02/2019   Acute stress reaction 09/09/2018   Slow transit constipation 05/26/2018   Vitamin D  insufficiency 08/29/2016   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   Class 3 severe obesity due to excess calories without serious comorbidity with body mass index (BMI) of 45.0 to 49.9 in adult 08/27/2016   Essential hypertension 08/13/2016   History of benign brain tumor 08/13/2016   Past Medical History:  Diagnosis Date   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   Hypertension    Past Surgical History:  Procedure Laterality Date   APPENDECTOMY     BRAIN TUMOR EXCISION  2002   CESAREAN SECTION N/A 01/13/2016   Procedure: CESAREAN SECTION;  Surgeon: Alm Cook, MD;  Location: [facility];  Service: Obstetrics;  Laterality: N/A;   Family History  Problem Relation Age of Onset   Arthritis Father    Hypertension Father    Cancer Maternal Aunt    Breast cancer Maternal Aunt 68   Diabetes Paternal Aunt    Hypertension Paternal Aunt    Cancer Maternal Grandfather    Hypertension Paternal Grandmother    Cancer Paternal Grandfather    Diabetes Paternal Aunt    Hypertension Paternal Aunt    Breast cancer Cousin 40   No Known Allergies    Patient Care Team: Yoder,  Kashawn Dirr L, PA-C as PCP - General (Family Medicine)   Outpatient Medications Prior to Visit  Medication Sig   omeprazole  (PRILOSEC) 40 MG capsule Take 1 capsule (40 mg total) by mouth daily.   ondansetron  (ZOFRAN -ODT) 8 MG disintegrating tablet Take 1 tablet (8 mg total) by mouth every 8 (eight) hours as needed.   polyethylene glycol (MIRALAX  / GLYCOLAX ) 17 g packet Take 17 g by mouth 2 (two) times daily. Until stooling regularly   [DISCONTINUED] bisoprolol -hydrochlorothiazide  (ZIAC ) 10-6.25 MG tablet Take 1 tablet by mouth daily.   [DISCONTINUED] desipramine  (NORPRAMIN ) 10 MG tablet Take 1 tablet (10 mg total) by  mouth at bedtime.   [DISCONTINUED] fluconazole  (DIFLUCAN ) 150 MG tablet Take one tablet now and then repeat in 48 hours.   No facility-administered medications prior to visit.    Review of Systems  All other systems reviewed and are negative.         Objective:     BP (!) 140/95 (BP Location: Right Arm, Patient Position: Sitting, Cuff Size: Large)   Pulse 70   Ht 5' 5 (1.651 m)   Wt (!) 315 lb (142.9 kg)   SpO2 99%   BMI 52.42 kg/m  BP Readings from Last 3 Encounters:  09/20/24 (!) 140/95  07/20/24 (!) 153/73  02/16/24 (!) 157/94   Wt Readings from Last 3 Encounters:  09/20/24 (!) 315 lb (142.9 kg)  07/20/24 (!) 315 lb (142.9 kg)  02/16/24 (!) 311 lb 12 oz (141.4 kg)      Physical Exam  BP (!) 140/95 (BP Location: Right Arm, Patient Position: Sitting, Cuff Size: Large)   Pulse 70   Ht 5' 5 (1.651 m)   Wt (!) 315 lb (142.9 kg)   SpO2 99%   BMI 52.42 kg/m   General Appearance:    Alert, cooperative, obese no distress, appears stated age  Head:    Normocephalic, without obvious abnormality, atraumatic  Eyes:    PERRL, conjunctiva/corneas clear, EOM's intact, fundi    benign, both eyes  Ears:    Normal TM's and external ear canals, both ears  Nose:   Nares normal, septum midline, mucosa normal, no drainage    or sinus tenderness  Throat:   Lips, mucosa, and tongue normal; teeth and gums normal  Neck:   Supple, symmetrical, trachea midline, no adenopathy;    thyroid :  no enlargement/tenderness/nodules; no carotid   bruit or JVD  Back:     Symmetric, no curvature, ROM normal, no CVA tenderness  Lungs:     Clear to auscultation bilaterally, respirations unlabored  Chest Wall:    No tenderness or deformity   Heart:    Regular rate and rhythm, S1 and S2 normal, no murmur, rub   or gallop     Abdomen:     Soft, non-tender, bowel sounds active all four quadrants,    no masses, no organomegaly        Extremities:   Extremities normal, atraumatic, no cyanosis or  edema  Pulses:   2+ and symmetric all extremities  Skin:   Skin color, texture, turgor normal, no rashes or lesions  Lymph nodes:   Cervical, supraclavicular, and axillary nodes normal  Neurologic:   CNII-XII intact, normal strength, sensation and reflexes    throughout      Assessment & Plan:    Routine Health Maintenance and Physical Exam  Immunization History  Administered Date(s) Administered   Td 08/01/2009   Td (Adult), 2 Lf Tetanus Toxid, Preservative Free  08/01/2009    Health Maintenance  Topic Date Due   Hepatitis B Vaccines 19-59 Average Risk (1 of 3 - 19+ 3-dose series) Never done   HPV VACCINES (1 - 3-dose SCDM series) Never done   Influenza Vaccine  03/28/2025 (Originally 07/29/2024)   Cervical Cancer Screening (HPV/Pap Cotest)  09/24/2027   Hepatitis C Screening  Completed   HIV Screening  Completed   Pneumococcal Vaccine  Aged Out   Meningococcal B Vaccine  Aged Out   DTaP/Tdap/Td  Discontinued   COVID-19 Vaccine  Discontinued    Discussed health benefits of physical activity, and encouraged her to engage in regular exercise appropriate for her age and condition. SABRA.Diagnoses and all orders for this visit:  Routine physical examination -     CBC with Differential/Platelet -     CMP14+EGFR -     Lipid panel -     VITAMIN D  25 Hydroxy (Vit-D Deficiency, Fractures) -     TSH + free T4 -     Hemoglobin A1c -     HIV antibody (with reflex)  Vulvodynia -     desipramine  (NORPRAMIN ) 10 MG tablet; Take 1 tablet (10 mg total) by mouth at bedtime.  Essential hypertension -     CMP14+EGFR -     bisoprolol -hydrochlorothiazide  (ZIAC ) 10-6.25 MG tablet; Take 1 tablet by mouth daily.  Class 3 severe obesity due to excess calories with serious comorbidity and body mass index (BMI) of 50.0 to 59.9 in adult  Family history of diabetes mellitus -     Hemoglobin A1c  Elevated fasting blood sugar -     CMP14+EGFR -     Hemoglobin A1c  Medication management -      CBC with Differential/Platelet -     CMP14+EGFR -     Lipid panel -     VITAMIN D  25 Hydroxy (Vit-D Deficiency, Fractures) -     TSH + free T4 -     Hemoglobin A1c  Screening for lipid disorders -     Lipid panel  Vitamin D  insufficiency -     VITAMIN D  25 Hydroxy (Vit-D Deficiency, Fractures)  Screening for HIV (human immunodeficiency virus) -     HIV antibody (with reflex)   .SABRA Discussed 150 minutes of exercise a week.  Encouraged vitamin D  1000 units and Calcium 1300mg  or 4 servings of dairy a day.  Fasting labs ordered today PHQ no concerns No falls BP elevated today Make sure compliant with BP medications and start checking at home Pt is morbidly obese Discussed weight loss with addition of diet, exercise and medication consideration Pt will start with life style changes- consider healthy weight and wellness referral Pap UTD Flu/covid vaccine declined Follow up in 6 months or sooner if needed  Return in about 6 months (around 03/20/2025) for weight follow up.     Leliana Kontz, PA-C

## 2024-09-20 NOTE — Patient Instructions (Signed)

## 2024-09-21 LAB — LIPID PANEL
Chol/HDL Ratio: 6 ratio — ABNORMAL HIGH (ref 0.0–4.4)
Cholesterol, Total: 215 mg/dL — ABNORMAL HIGH (ref 100–199)
HDL: 36 mg/dL — ABNORMAL LOW (ref >39)
LDL Chol Calc (NIH): 160 mg/dL — ABNORMAL HIGH (ref 0–99)
Triglycerides: 102 mg/dL (ref 0–149)
VLDL Cholesterol Cal: 19 mg/dL (ref 5–40)

## 2024-09-21 LAB — CMP14+EGFR
ALT: 13 IU/L (ref 0–32)
AST: 12 IU/L (ref 0–40)
Albumin: 4.3 g/dL (ref 3.9–4.9)
Alkaline Phosphatase: 82 IU/L (ref 41–116)
BUN/Creatinine Ratio: 12 (ref 9–23)
BUN: 8 mg/dL (ref 6–20)
Bilirubin Total: 0.4 mg/dL (ref 0.0–1.2)
CO2: 20 mmol/L (ref 20–29)
Calcium: 9.6 mg/dL (ref 8.7–10.2)
Chloride: 101 mmol/L (ref 96–106)
Creatinine, Ser: 0.65 mg/dL (ref 0.57–1.00)
Globulin, Total: 3 g/dL (ref 1.5–4.5)
Glucose: 81 mg/dL (ref 70–99)
Potassium: 4.7 mmol/L (ref 3.5–5.2)
Sodium: 135 mmol/L (ref 134–144)
Total Protein: 7.3 g/dL (ref 6.0–8.5)
eGFR: 115 mL/min/1.73 (ref >59)

## 2024-09-21 LAB — HIV ANTIBODY (ROUTINE TESTING W REFLEX): HIV Screen 4th Generation wRfx: NONREACTIVE

## 2024-09-21 LAB — CBC WITH DIFFERENTIAL/PLATELET
Basophils Absolute: 0.1 x10E3/uL (ref 0.0–0.2)
Basos: 1 %
EOS (ABSOLUTE): 0.2 x10E3/uL (ref 0.0–0.4)
Eos: 3 %
Hematocrit: 36.7 % (ref 34.0–46.6)
Hemoglobin: 12.1 g/dL (ref 11.1–15.9)
Immature Grans (Abs): 0 x10E3/uL (ref 0.0–0.1)
Immature Granulocytes: 0 %
Lymphocytes Absolute: 2.6 x10E3/uL (ref 0.7–3.1)
Lymphs: 44 %
MCH: 28.5 pg (ref 26.6–33.0)
MCHC: 33 g/dL (ref 31.5–35.7)
MCV: 86 fL (ref 79–97)
Monocytes Absolute: 0.4 x10E3/uL (ref 0.1–0.9)
Monocytes: 7 %
Neutrophils Absolute: 2.6 x10E3/uL (ref 1.4–7.0)
Neutrophils: 45 %
Platelets: 368 x10E3/uL (ref 150–450)
RBC: 4.25 x10E6/uL (ref 3.77–5.28)
RDW: 14.5 % (ref 11.7–15.4)
WBC: 5.8 x10E3/uL (ref 3.4–10.8)

## 2024-09-21 LAB — HEMOGLOBIN A1C
Est. average glucose Bld gHb Est-mCnc: 111 mg/dL
Hgb A1c MFr Bld: 5.5 % (ref 4.8–5.6)

## 2024-09-21 LAB — TSH+FREE T4
Free T4: 0.98 ng/dL (ref 0.82–1.77)
TSH: 1.41 u[IU]/mL (ref 0.450–4.500)

## 2024-09-21 LAB — VITAMIN D 25 HYDROXY (VIT D DEFICIENCY, FRACTURES): Vit D, 25-Hydroxy: 27.4 ng/mL — ABNORMAL LOW (ref 30.0–100.0)

## 2024-09-26 ENCOUNTER — Ambulatory Visit: Payer: Self-pay | Admitting: Physician Assistant

## 2024-09-26 ENCOUNTER — Encounter: Payer: Self-pay | Admitting: Physician Assistant

## 2024-09-26 NOTE — Progress Notes (Signed)
 Sam,   Kidney, liver, glucose looks great.  HIV negative.  A1C is normal, no diabetes.  Thyroid  normal.  Normal hemoglobin.  Vitamin D  low. Make sure taking at least 2000 units of vitamin D .  LDL not to goal.  Work on diet low fried/fatty/processed foods. Recheck in one year.

## 2024-10-17 ENCOUNTER — Encounter: Payer: Self-pay | Admitting: Physician Assistant

## 2024-10-18 MED ORDER — AMLODIPINE BESYLATE 2.5 MG PO TABS: 2.5000 mg | ORAL_TABLET | Freq: Every day | ORAL | 0 refills | Status: DC

## 2024-10-18 NOTE — Telephone Encounter (Signed)
 Pended prescription of amlodipine 2.5mg 

## 2024-10-18 NOTE — Addendum Note (Signed)
 Addended by: Kalliopi Coupland P on: 10/18/2024 09:04 AM   Modules accepted: Orders

## 2024-10-18 NOTE — Addendum Note (Signed)
 Addended by: ANTONIETTE VERMELL CROME on: 10/18/2024 12:49 PM   Modules accepted: Orders

## 2024-11-09 ENCOUNTER — Ambulatory Visit: Admitting: Obstetrics and Gynecology

## 2024-11-09 ENCOUNTER — Encounter: Payer: Self-pay | Admitting: Obstetrics and Gynecology

## 2024-11-09 ENCOUNTER — Other Ambulatory Visit (HOSPITAL_COMMUNITY)
Admission: RE | Admit: 2024-11-09 | Discharge: 2024-11-09 | Disposition: A | Discharge status: Home / Self Care | Source: Ambulatory Visit | Attending: Obstetrics and Gynecology | Admitting: Obstetrics and Gynecology

## 2024-11-09 VITALS — BP 117/75 | HR 67 | Ht 65.0 in | Wt 311.0 lb

## 2024-11-09 DIAGNOSIS — B372 Candidiasis of skin and nail: Secondary | ICD-10-CM

## 2024-11-09 DIAGNOSIS — K602 Anal fissure, unspecified: Secondary | ICD-10-CM

## 2024-11-09 DIAGNOSIS — L293 Anogenital pruritus, unspecified: Secondary | ICD-10-CM

## 2024-11-09 DIAGNOSIS — L280 Lichen simplex chronicus: Secondary | ICD-10-CM

## 2024-11-09 DIAGNOSIS — R232 Flushing: Secondary | ICD-10-CM | POA: Diagnosis not present

## 2024-11-09 MED ORDER — NYSTATIN 100000 UNIT/GM EX POWD: 1.0000 | Freq: Two times a day (BID) | CUTANEOUS | 1 refills | Status: AC | PRN

## 2024-11-09 MED ORDER — NITROGLYCERIN 0.4 % RE OINT: 1.0000 | TOPICAL_OINTMENT | Freq: Two times a day (BID) | RECTAL | 2 refills | Status: AC

## 2024-11-09 MED ORDER — TRIAMCINOLONE ACETONIDE 0.1 % EX OINT: 1.0000 | TOPICAL_OINTMENT | Freq: Two times a day (BID) | CUTANEOUS | 1 refills | Status: AC

## 2024-11-09 NOTE — Progress Notes (Signed)
 GYNECOLOGY OFFICE VISIT NOTE  History:  Lauren Melton is a 38 y.o. G1P1001 here today for a couple concerns:  Hot flashes that started this summer. Come and go. Cycles still regular.  Itching irritation in her groin. Has used Desitin in this area. Gets something similar under her breasts.  Pain and swelling at her perineum. Sometimes burning. Comes and goes making diagnosis difficult. She has wondered about a biopsy to help with treatment.   4. She notes vaginal odor but that it is mild. Describes odor as fishy.    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   Health Maintenance:   Normal pap and negative HRHPV:   Diagnosis  Date Value Ref Range Status  09/23/2022   Final   - Negative for intraepithelial lesion or malignancy (NILM)    Review of Systems:  Pertinent items noted in HPI and remainder of comprehensive ROS otherwise negative.  Physical Exam:  BP 117/75   Pulse 67   Ht 5' 5 (1.651 m)   Wt (!) 311 lb (141.1 kg)   LMP 10/24/2024 (Approximate)   BMI 51.75 kg/m  CONSTITUTIONAL: Well-developed, well-nourished female in no acute distress.  HEENT:  Normocephalic, atraumatic. External right and left ear normal. No scleral icterus.  NECK: Normal range of motion, supple, no masses noted on observation SKIN: No rash noted. Not diaphoretic. No erythema. No pallor. MUSCULOSKELETAL: Normal range of motion. No edema noted. NEUROLOGIC:  Alert and oriented to person, place, and time. Normal muscle tone coordination. No cranial nerve deficit noted. PSYCHIATRIC: Normal mood and affect. Normal behavior. Normal judgment and thought content.  PELVIC: Some yeast appearance in inguinal area- very mild, but faint white. No erythema. 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 Perinenum overall normal but around the perineum and anal area small areas of redness with appearance concerning for fissures. May also be HSV.   Labs and Imaging No results found for this or any previous visit (from the past week). No results found.  Assessment and Plan:  1. Perineal irritation (Primary) See below - Cervicovaginal ancillary only( [place])  2. Hot flashes Discussed checking labs to rule out other causes of hot flashes. We discussed options for treatment if thought to be perimenopause (more likely). We discussed HT with OCP vs with anti-depressant class I.e. paroxetine and venlafaxine. She may be interested in non-HT. She thinks she could also have some depression especially around this time due to loss of her family.  - TSH Rfx on Abnormal to Free T4 - Follicle stimulating hormone - Estradiol  - CBC  3. Anal fissure Also possible HSV2.  Will send in treatment for this as well. BID for 8 weeks.  -     Nitroglycerin 0.4 % OINT; Place 1 Application rectally in the morning and at bedtime. Apply to anal area for 8 weeks  4. 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 Will do steroid taper and have her return in 12 weeks to reevaluate this and her perineum.  -     triamcinolone  ointment (KENALOG ) 0.1 %; Apply 1 Application topically 2 (two) times daily. Twice a day for 2 weeks, nightly for 2 weeks, then twice weekly for 6 weeks - apply to labial area  5. Yeast infection of the skin Gets this in groin and under breasts.  -     nystatin powder; Apply 1  Application topically 2 (two) times daily as needed. Apply under folds of skin as needed     Meds ordered this encounter  Medications   Nitroglycerin 0.4 % OINT    Sig: Place 1 Application rectally in the morning and at bedtime. Apply to anal area for 8 weeks    Dispense:  30 g    Refill:  2   nystatin powder    Sig: Apply 1 Application topically 2 (two) times daily as needed. Apply under folds of skin as needed    Dispense:  60 g    Refill:  1   triamcinolone  ointment (KENALOG ) 0.1 %    Sig: Apply 1 Application topically 2 (two) times daily. Twice a day for 2 weeks, nightly for 2 weeks, then twice weekly for 6 weeks - apply to labial area    Dispense:  70 g    Refill:  1     Routine preventative health maintenance measures emphasized. Please refer to After Visit Summary for other counseling recommendations.   Return in about 12 weeks (around 02/01/2025) for Follow up.  Vina Solian, MD, FACOG Obstetrician & Gynecologist, [facility] for [facility], [facility]

## 2024-11-09 NOTE — Progress Notes (Signed)
   Subjective:    Patient ID: Lauren Melton, female    DOB: [birth year], 38 y.o.   MRN: [id number]  HPI  Patient is here for a BP Check last OV with PCP was 140/95. Patient has been taking bisoprolol -hydrochlorothiazide  (ZIAC ) 10-6.25 MG tablet. Amlodipine 2.5 was added 10/18/24 and was told if BP not under 130/80 after the addition of 2.5 amlodipine in 2 weeks then ok to take 5mg  of amlodipine. Denies SOB, CP, heart palpitations, headache, or vision changes.  Review of Systems     Objective:   Physical Exam        Assessment & Plan:   Patients first BP reading was 117/75.  Reported to Dr. Alvan chafe covering for  [facility] who would like to make no changes. Patients BP looks great. Pt advised to RTC PRN

## 2024-11-10 ENCOUNTER — Ambulatory Visit: Payer: Self-pay | Admitting: Obstetrics and Gynecology

## 2024-11-10 ENCOUNTER — Ambulatory Visit (INDEPENDENT_AMBULATORY_CARE_PROVIDER_SITE_OTHER)

## 2024-11-10 VITALS — BP 117/75 | HR 68 | Resp 18 | Ht 65.0 in | Wt 311.0 lb

## 2024-11-10 DIAGNOSIS — I10 Essential (primary) hypertension: Secondary | ICD-10-CM | POA: Diagnosis not present

## 2024-11-10 LAB — CERVICOVAGINAL ANCILLARY ONLY
Bacterial Vaginitis (gardnerella): NEGATIVE
Candida Glabrata: NEGATIVE
Candida Vaginitis: NEGATIVE
Comment: NEGATIVE
Comment: NEGATIVE
Comment: NEGATIVE
Comment: NEGATIVE
Trichomonas: NEGATIVE

## 2024-11-10 LAB — FOLLICLE STIMULATING HORMONE: FSH: 1.3 m[IU]/mL

## 2024-11-10 LAB — CBC
Hematocrit: 37.1 % (ref 34.0–46.6)
Hemoglobin: 12.2 g/dL (ref 11.1–15.9)
MCH: 28.5 pg (ref 26.6–33.0)
MCHC: 32.9 g/dL (ref 31.5–35.7)
MCV: 87 fL (ref 79–97)
Platelets: 470 x10E3/uL — ABNORMAL HIGH (ref 150–450)
RBC: 4.28 x10E6/uL (ref 3.77–5.28)
RDW: 14.4 % (ref 11.7–15.4)
WBC: 8 x10E3/uL (ref 3.4–10.8)

## 2024-11-10 LAB — TSH RFX ON ABNORMAL TO FREE T4: TSH: 1.56 u[IU]/mL (ref 0.450–4.500)

## 2024-11-10 LAB — ESTRADIOL: Estradiol: 201 pg/mL

## 2024-11-12 LAB — HERPES SIMPLEX VIRUS CULTURE

## 2024-11-17 ENCOUNTER — Ambulatory Visit: Admitting: Obstetrics and Gynecology

## 2025-01-01 ENCOUNTER — Other Ambulatory Visit: Payer: Self-pay | Admitting: Physician Assistant

## 2025-01-12 ENCOUNTER — Encounter: Payer: Self-pay | Admitting: Physician Assistant

## 2025-03-21 ENCOUNTER — Ambulatory Visit: Admitting: Physician Assistant
